# Patient Record
Sex: Female | Born: 1960 | Race: White | Hispanic: No | Marital: Single | Smoking: Former smoker
Health system: Southern US, Community
[De-identification: ages and names within clinical notes are randomized; demographics above are authoritative.]

## PROBLEM LIST (undated history)

## (undated) DIAGNOSIS — K56609 Unspecified intestinal obstruction, unspecified as to partial versus complete obstruction: Secondary | ICD-10-CM

## (undated) DIAGNOSIS — K5010 Crohn's disease of large intestine without complications: Secondary | ICD-10-CM

## (undated) HISTORY — PX: TONSILLECTOMY: SUR1361

## (undated) HISTORY — PX: ABDOMINAL HYSTERECTOMY: SHX81

## (undated) HISTORY — PX: APPENDECTOMY: SHX54

---

## 1997-09-19 ENCOUNTER — Other Ambulatory Visit: Admission: RE | Admit: 1997-09-19 | Discharge: 1997-09-19 | Payer: Self-pay | Admitting: *Deleted

## 2000-09-05 ENCOUNTER — Encounter: Admission: RE | Admit: 2000-09-05 | Discharge: 2000-09-05 | Payer: Self-pay

## 2003-09-14 ENCOUNTER — Emergency Department (HOSPITAL_COMMUNITY): Admission: AD | Admit: 2003-09-14 | Discharge: 2003-09-14 | Payer: Self-pay | Admitting: Family Medicine

## 2009-08-13 ENCOUNTER — Encounter: Admission: RE | Admit: 2009-08-13 | Discharge: 2009-08-13 | Payer: Self-pay | Admitting: Gastroenterology

## 2010-06-28 ENCOUNTER — Encounter: Payer: Self-pay | Admitting: Gastroenterology

## 2014-11-06 DIAGNOSIS — K56609 Unspecified intestinal obstruction, unspecified as to partial versus complete obstruction: Secondary | ICD-10-CM

## 2014-11-06 HISTORY — DX: Unspecified intestinal obstruction, unspecified as to partial versus complete obstruction: K56.609

## 2014-11-13 ENCOUNTER — Inpatient Hospital Stay (HOSPITAL_COMMUNITY)
Admission: EM | Admit: 2014-11-13 | Discharge: 2014-11-23 | DRG: 386 | Disposition: A | Discharge status: Home / Self Care | Payer: Managed Care, Other (non HMO) | Attending: Oncology | Admitting: Oncology

## 2014-11-13 ENCOUNTER — Emergency Department (HOSPITAL_COMMUNITY): Payer: Managed Care, Other (non HMO)

## 2014-11-13 ENCOUNTER — Encounter (HOSPITAL_COMMUNITY): Payer: Self-pay | Admitting: Emergency Medicine

## 2014-11-13 DIAGNOSIS — E440 Moderate protein-calorie malnutrition: Secondary | ICD-10-CM | POA: Diagnosis present

## 2014-11-13 DIAGNOSIS — K5090 Crohn's disease, unspecified, without complications: Secondary | ICD-10-CM

## 2014-11-13 DIAGNOSIS — R7989 Other specified abnormal findings of blood chemistry: Secondary | ICD-10-CM | POA: Diagnosis present

## 2014-11-13 DIAGNOSIS — F1120 Opioid dependence, uncomplicated: Secondary | ICD-10-CM | POA: Diagnosis present

## 2014-11-13 DIAGNOSIS — T403X5A Adverse effect of methadone, initial encounter: Secondary | ICD-10-CM | POA: Diagnosis present

## 2014-11-13 DIAGNOSIS — Z978 Presence of other specified devices: Secondary | ICD-10-CM

## 2014-11-13 DIAGNOSIS — K5909 Other constipation: Secondary | ICD-10-CM | POA: Diagnosis present

## 2014-11-13 DIAGNOSIS — K5080 Crohn's disease of both small and large intestine without complications: Secondary | ICD-10-CM | POA: Diagnosis present

## 2014-11-13 DIAGNOSIS — R188 Other ascites: Secondary | ICD-10-CM | POA: Diagnosis present

## 2014-11-13 DIAGNOSIS — F419 Anxiety disorder, unspecified: Secondary | ICD-10-CM | POA: Diagnosis present

## 2014-11-13 DIAGNOSIS — D473 Essential (hemorrhagic) thrombocythemia: Secondary | ICD-10-CM | POA: Diagnosis present

## 2014-11-13 DIAGNOSIS — R1084 Generalized abdominal pain: Secondary | ICD-10-CM

## 2014-11-13 DIAGNOSIS — Z79891 Long term (current) use of opiate analgesic: Secondary | ICD-10-CM

## 2014-11-13 DIAGNOSIS — Z79899 Other long term (current) drug therapy: Secondary | ICD-10-CM | POA: Diagnosis not present

## 2014-11-13 DIAGNOSIS — T380X5A Adverse effect of glucocorticoids and synthetic analogues, initial encounter: Secondary | ICD-10-CM | POA: Diagnosis present

## 2014-11-13 DIAGNOSIS — J9811 Atelectasis: Secondary | ICD-10-CM | POA: Diagnosis not present

## 2014-11-13 DIAGNOSIS — Z6820 Body mass index (BMI) 20.0-20.9, adult: Secondary | ICD-10-CM | POA: Diagnosis not present

## 2014-11-13 DIAGNOSIS — D72829 Elevated white blood cell count, unspecified: Secondary | ICD-10-CM | POA: Diagnosis present

## 2014-11-13 DIAGNOSIS — K56609 Unspecified intestinal obstruction, unspecified as to partial versus complete obstruction: Secondary | ICD-10-CM

## 2014-11-13 DIAGNOSIS — K56600 Partial intestinal obstruction, unspecified as to cause: Secondary | ICD-10-CM | POA: Diagnosis present

## 2014-11-13 DIAGNOSIS — K50912 Crohn's disease, unspecified, with intestinal obstruction: Secondary | ICD-10-CM | POA: Diagnosis not present

## 2014-11-13 DIAGNOSIS — K50812 Crohn's disease of both small and large intestine with intestinal obstruction: Secondary | ICD-10-CM | POA: Diagnosis not present

## 2014-11-13 DIAGNOSIS — K5010 Crohn's disease of large intestine without complications: Secondary | ICD-10-CM | POA: Diagnosis present

## 2014-11-13 DIAGNOSIS — D75838 Other thrombocytosis: Secondary | ICD-10-CM | POA: Diagnosis present

## 2014-11-13 DIAGNOSIS — K50112 Crohn's disease of large intestine with intestinal obstruction: Secondary | ICD-10-CM

## 2014-11-13 DIAGNOSIS — R110 Nausea: Secondary | ICD-10-CM | POA: Diagnosis present

## 2014-11-13 DIAGNOSIS — K5669 Other intestinal obstruction: Secondary | ICD-10-CM | POA: Diagnosis not present

## 2014-11-13 HISTORY — DX: Crohn's disease of large intestine without complications: K50.10

## 2014-11-13 HISTORY — DX: Unspecified intestinal obstruction, unspecified as to partial versus complete obstruction: K56.609

## 2014-11-13 LAB — URINE MICROSCOPIC-ADD ON

## 2014-11-13 LAB — COMPREHENSIVE METABOLIC PANEL
ALK PHOS: 124 U/L (ref 38–126)
ALT: 17 U/L (ref 14–54)
AST: 20 U/L (ref 15–41)
Albumin: 2.7 g/dL — ABNORMAL LOW (ref 3.5–5.0)
Anion gap: 12 (ref 5–15)
BILIRUBIN TOTAL: 0.5 mg/dL (ref 0.3–1.2)
BUN: 7 mg/dL (ref 6–20)
CO2: 23 mmol/L (ref 22–32)
Calcium: 8.6 mg/dL — ABNORMAL LOW (ref 8.9–10.3)
Chloride: 97 mmol/L — ABNORMAL LOW (ref 101–111)
Creatinine, Ser: 0.73 mg/dL (ref 0.44–1.00)
GFR calc non Af Amer: >60 mL/min (ref >60)
Glucose, Bld: 273 mg/dL — ABNORMAL HIGH (ref 65–99)
Potassium: 3.4 mmol/L — ABNORMAL LOW (ref 3.5–5.1)
Sodium: 132 mmol/L — ABNORMAL LOW (ref 135–145)
TOTAL PROTEIN: 6.5 g/dL (ref 6.5–8.1)

## 2014-11-13 LAB — CBC WITH DIFFERENTIAL/PLATELET
BASOS ABS: 0 10*3/uL (ref 0.0–0.1)
Basophils Relative: 0 % (ref 0–1)
Eosinophils Absolute: 0 10*3/uL (ref 0.0–0.7)
Eosinophils Relative: 0 % (ref 0–5)
HEMATOCRIT: 34.5 % — AB (ref 36.0–46.0)
Hemoglobin: 11.5 g/dL — ABNORMAL LOW (ref 12.0–15.0)
Lymphocytes Relative: 12 % (ref 12–46)
Lymphs Abs: 1.2 10*3/uL (ref 0.7–4.0)
MCH: 26.6 pg (ref 26.0–34.0)
MCHC: 33.3 g/dL (ref 30.0–36.0)
MCV: 79.7 fL (ref 78.0–100.0)
Monocytes Absolute: 0.5 10*3/uL (ref 0.1–1.0)
Monocytes Relative: 5 % (ref 3–12)
Neutro Abs: 8.2 10*3/uL — ABNORMAL HIGH (ref 1.7–7.7)
Neutrophils Relative %: 83 % — ABNORMAL HIGH (ref 43–77)
PLATELETS: 590 10*3/uL — AB (ref 150–400)
RBC: 4.33 MIL/uL (ref 3.87–5.11)
RDW: 15.7 % — AB (ref 11.5–15.5)
WBC: 9.9 10*3/uL (ref 4.0–10.5)

## 2014-11-13 LAB — URINALYSIS, ROUTINE W REFLEX MICROSCOPIC
Bilirubin Urine: NEGATIVE
Glucose, UA: NEGATIVE mg/dL
Ketones, ur: NEGATIVE mg/dL
LEUKOCYTES UA: NEGATIVE
NITRITE: NEGATIVE
Protein, ur: NEGATIVE mg/dL
UROBILINOGEN UA: 0.2 mg/dL (ref 0.0–1.0)
pH: 5.5 (ref 5.0–8.0)

## 2014-11-13 LAB — I-STAT CG4 LACTIC ACID, ED
Lactic Acid, Venous: 1.61 mmol/L (ref 0.5–2.0)
Lactic Acid, Venous: 1.61 mmol/L (ref 0.5–2.0)

## 2014-11-13 LAB — CREATININE, SERUM
Creatinine, Ser: 0.71 mg/dL (ref 0.44–1.00)
GFR calc Af Amer: >60 mL/min (ref >60)
GFR calc non Af Amer: >60 mL/min (ref >60)

## 2014-11-13 LAB — CBC
HEMATOCRIT: 31.9 % — AB (ref 36.0–46.0)
Hemoglobin: 10.8 g/dL — ABNORMAL LOW (ref 12.0–15.0)
MCH: 26.6 pg (ref 26.0–34.0)
MCHC: 33.9 g/dL (ref 30.0–36.0)
MCV: 78.6 fL (ref 78.0–100.0)
Platelets: 412 10*3/uL — ABNORMAL HIGH (ref 150–400)
RBC: 4.06 MIL/uL (ref 3.87–5.11)
RDW: 15.9 % — ABNORMAL HIGH (ref 11.5–15.5)
WBC: 14.7 10*3/uL — ABNORMAL HIGH (ref 4.0–10.5)

## 2014-11-13 LAB — C-REACTIVE PROTEIN: CRP: 14.4 mg/dL — ABNORMAL HIGH (ref <1.0)

## 2014-11-13 MED ORDER — SODIUM CHLORIDE 0.9 % IV BOLUS (SEPSIS)
1000.0000 mL | Freq: Once | INTRAVENOUS | Status: AC
  Administered 2014-11-13: 1000 mL via INTRAVENOUS

## 2014-11-13 MED ORDER — METHYLPREDNISOLONE SODIUM SUCC 125 MG IJ SOLR
125.0000 mg | Freq: Once | INTRAMUSCULAR | Status: AC
  Administered 2014-11-13: 125 mg via INTRAVENOUS
  Filled 2014-11-13: qty 2

## 2014-11-13 MED ORDER — PROMETHAZINE HCL 25 MG/ML IJ SOLN
25.0000 mg | Freq: Once | INTRAMUSCULAR | Status: AC
  Administered 2014-11-13: 25 mg via INTRAVENOUS
  Filled 2014-11-13: qty 1

## 2014-11-13 MED ORDER — CIPROFLOXACIN IN D5W 400 MG/200ML IV SOLN
400.0000 mg | Freq: Two times a day (BID) | INTRAVENOUS | Status: DC
  Administered 2014-11-13 – 2014-11-17 (×9): 400 mg via INTRAVENOUS
  Filled 2014-11-13 (×10): qty 200

## 2014-11-13 MED ORDER — METRONIDAZOLE IN NACL 5-0.79 MG/ML-% IV SOLN
500.0000 mg | Freq: Three times a day (TID) | INTRAVENOUS | Status: DC
  Administered 2014-11-13 – 2014-11-17 (×13): 500 mg via INTRAVENOUS
  Filled 2014-11-13 (×15): qty 100

## 2014-11-13 MED ORDER — HYDROMORPHONE HCL 1 MG/ML IJ SOLN
1.0000 mg | Freq: Once | INTRAMUSCULAR | Status: AC
  Administered 2014-11-13: 1 mg via INTRAVENOUS
  Filled 2014-11-13: qty 1

## 2014-11-13 MED ORDER — ONDANSETRON HCL 4 MG/2ML IJ SOLN
4.0000 mg | Freq: Once | INTRAMUSCULAR | Status: AC
  Administered 2014-11-13: 4 mg via INTRAVENOUS
  Filled 2014-11-13: qty 2

## 2014-11-13 MED ORDER — PROMETHAZINE HCL 25 MG/ML IJ SOLN
25.0000 mg | INTRAMUSCULAR | Status: DC | PRN
  Administered 2014-11-13 (×2): 25 mg via INTRAVENOUS
  Filled 2014-11-13 (×2): qty 1

## 2014-11-13 MED ORDER — IOHEXOL 300 MG/ML  SOLN
80.0000 mL | Freq: Once | INTRAMUSCULAR | Status: AC | PRN
  Administered 2014-11-13: 80 mL via INTRAVENOUS

## 2014-11-13 MED ORDER — METHADONE HCL 10 MG PO TABS
75.0000 mg | ORAL_TABLET | Freq: Every day | ORAL | Status: DC
  Administered 2014-11-14 – 2014-11-15 (×2): 75 mg via ORAL
  Filled 2014-11-13 (×2): qty 8

## 2014-11-13 MED ORDER — ONDANSETRON HCL 4 MG/2ML IJ SOLN: 4.0000 mg | Freq: Four times a day (QID) | INTRAMUSCULAR | Status: DC | PRN

## 2014-11-13 MED ORDER — POTASSIUM CHLORIDE IN NACL 20-0.9 MEQ/L-% IV SOLN
INTRAVENOUS | Status: DC
  Administered 2014-11-13 – 2014-11-15 (×4): via INTRAVENOUS
  Filled 2014-11-13 (×6): qty 1000

## 2014-11-13 MED ORDER — METHYLPREDNISOLONE SODIUM SUCC 125 MG IJ SOLR
80.0000 mg | Freq: Four times a day (QID) | INTRAMUSCULAR | Status: DC
  Administered 2014-11-13 – 2014-11-17 (×17): 80 mg via INTRAVENOUS
  Filled 2014-11-13 (×17): qty 2

## 2014-11-13 MED ORDER — HEPARIN SODIUM (PORCINE) 5000 UNIT/ML IJ SOLN
5000.0000 [IU] | Freq: Three times a day (TID) | INTRAMUSCULAR | Status: DC
  Administered 2014-11-13 – 2014-11-15 (×6): 5000 [IU] via SUBCUTANEOUS
  Filled 2014-11-13 (×5): qty 1

## 2014-11-13 MED ORDER — ACETAMINOPHEN 650 MG RE SUPP
650.0000 mg | Freq: Four times a day (QID) | RECTAL | Status: DC | PRN
  Administered 2014-11-13: 650 mg via RECTAL
  Filled 2014-11-13: qty 1

## 2014-11-13 NOTE — H&P (Signed)
Date: 11/13/2014               Patient Name:  Krystal [name] MRN: [id number]  DOB: [birth year] Age / Sex: 54 y.o., female   PCP: No Pcp Per Patient         Medical Service: Internal Medicine Teaching Service         Attending Physician: Dr. Oval Linsey, MD    First Contact: Dr. Trudee Kuster Pager: [phone]  Second Contact: Dr. Gordy Levan Pager: [phone]       After Hours (After 5p/  First Contact Pager: [phone]  weekends / holidays): Second Contact Pager: [phone]   Chief Complaint: abdominal pain  History of Present Illness: [name] is a 51 yr woman pmh Crohns disease not on any treatment and managed by methadone 75mg  presents with ongoing RLQ pain, nausea, and anorexia x2wks. Patient states that she has not followed with with her GI doctor since her "presumed" diagnosis and was given phenergan suppositories only at her initial visit. Since that time she has had bouts of pain and some anorexia/nausea that usually resolve in 1-2 wks and she never sought treatment or returned for her follow-up. 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 She has only been tolerating baby food. She states that the intensity of the pain got worse overnight along with the nausea, chills that caused her to present for evaluation. She denied any BRBPR, hematochezia, hematemesis, chest pain, inability to pass flatus, constipation, or skin rashes. Pt denied any recent illnesses. She is a non-smoker and doesn't use any other drugs.   Meds: Current Facility-Administered Medications  Medication Dose Route Frequency Provider Last Rate Last Dose  . HYDROmorphone (DILAUDID) injection 1 mg  1 mg Intravenous Once Merryl Hacker, MD      . methylPREDNISolone sodium succinate (SOLU-MEDROL) 125 mg/2 mL injection 125 mg  125 mg Intravenous Once Merryl Hacker, MD      . sodium chloride 0.9 % bolus 1,000 mL  1,000 mL Intravenous Once Merryl Hacker, MD       Current Outpatient Prescriptions  Medication Sig Dispense Refill  . methadone (DOLOPHINE) 10 MG/ML solution Take 75 mg by mouth daily.    . polyethylene glycol (MIRALAX / GLYCOLAX) packet Take 17 g by mouth daily.      Allergies: Allergies as of 11/13/2014 - Review Complete 11/13/2014  Allergen Reaction Noted  . Sulfa antibiotics Rash 11/13/2014   History reviewed. No pertinent past medical history. Past Surgical History  Procedure Laterality Date  . Abdominal hysterectomy     No family history on file. History   Social History  . Marital Status: Single    Spouse Name: N/A  . Number of Children: N/A  . Years of Education: N/A   Occupational History  . Not on file.   Social History Main Topics  . Smoking status: Never Smoker   . Smokeless tobacco: Not on file  . Alcohol Use: No  . Drug Use: Not on file  . Sexual Activity: Not on file   Other Topics Concern  . Not on file   Social History Narrative  . No narrative on file    Review of Systems: Pertinent items are noted in HPI.  Physical Exam: Blood pressure 128/68, pulse 99, resp. rate 30, SpO2 94 %. General: resting in  bed, uncomfortable, cachectic  HEENT: PERRL, EOMI, no scleral icterus Cardiac: RRR, no rubs, murmurs or gallops Pulm: clear to auscultation bilaterally, no crackles, wheezes, or rhonchi, moving normal volumes of air Abd: tense abdominal muscles, RLQ tenderness, some guarding, slightly distended, BS present Ext: warm and well perfused, no pedal edema Neuro: alert and oriented X3, cranial nerves II-XII grossly intact  Lab results: Basic Metabolic Panel:  Recent Labs  11/13/14 0258  NA 132*  K 3.4*  CL 97*  CO2 23  GLUCOSE 273*  BUN 7  CREATININE 0.73  CALCIUM 8.6*   Liver Function Tests:  Recent Labs  11/13/14 0258  AST 20  ALT 17  ALKPHOS 124  BILITOT 0.5  PROT 6.5   ALBUMIN 2.7*   CBC:  Recent Labs  11/13/14 0258  WBC 9.9  NEUTROABS 8.2*  HGB 11.5*  HCT 34.5*  MCV 79.7  PLT 590*   Imaging results:  Ct Abdomen Pelvis W Contrast  11/13/2014   CLINICAL DATA:  Severe generalized abdominal pain. Nausea. Crohn's disease.  EXAM: CT ABDOMEN AND PELVIS WITH CONTRAST  TECHNIQUE: Multidetector CT imaging of the abdomen and pelvis was performed using the standard protocol following bolus administration of intravenous contrast.  CONTRAST:  39mL OMNIPAQUE IOHEXOL 300 MG/ML  SOLN  COMPARISON:  08/13/2009  FINDINGS: Lower Chest:  Unremarkable.  Hepatobiliary: No masses identified. Mild periportal edema noted. Gallbladder is unremarkable.  Pancreas: No mass, inflammatory changes, or other significant abnormality identified.  Spleen:  Within normal limits in size and appearance.  Adrenals:  No masses identified.  Kidneys/Urinary Tract:  No evidence of masses or hydronephrosis.  Stomach/Bowel/Peritoneum: Mild to moderate dilatation of mid and distal small bowel loops is seen. There is moderate wall thickening and abnormal mucosal enhancement seen multiple loops of ileum within the right abdomen and pelvis, including the terminal ileum. Adjacent inflammatory changes are seen within the right abdominal and pelvic mesentery and there is also mild wall thickening involving the cecum and ascending colon. This is consistent with active Crohn's disease, with associated partial small bowel obstruction.  Mild to moderate ascites is seen in the both upper quadrants, right paracolic gutter, and pelvis, however no focal abscesses visualized. Small hiatal hernia also noted.  Vascular/Lymphatic: Mild lymphadenopathy is seen in the central small bowel mesentery, likely reactive in etiology. No other sites of lymphadenopathy identified.  Reproductive: Prior hysterectomy noted. No adnexal masses identified  Other:  None.  Musculoskeletal:  No suspicious bone lesions identified.  IMPRESSION:  Findings consistent with active Crohn's disease involving multiple distal small bowel loops including the terminal ileum, and the cecum. This is causing a partial small bowel obstruction.  Mild to moderate ascites mainly in the right abdomen and pelvis. No focal abscess or free air identified.  Mild lymphadenopathy in small bowel mesentery, likely reactive in etiology.   Electronically Signed   By: Earle Gell M.D.   On: 11/13/2014 07:20    Assessment & Plan by Problem: 1. Crohn's colitis: Pt has presumed crohn's and didn't follow up with her GI doctor. CT Abd shows finding consistent with active crohn's that explain pt RLQ pain. Pt without significant leukocytosis or fever. Pt CDAI score is between 100-150 therefore mild-moderate disease.  -NPO -phenergan for nausea -GI consult -continue IV solumedrol -continue pain management -GI pathogen panel to r/o co-infection as reason for flare -CRP -start IV cipro/flagyl for intraabdominal inflammation worry for microperforation -serial abdominal exams for concern of potential obstruction and need for surgery  Dispo: Disposition  is deferred at this time, awaiting improvement of current medical problems. Anticipated discharge in approximately 3-4 day(s).   The patient does not have a current PCP (No Pcp Per Patient) and does need an [facility] hospital follow-up appointment after discharge.  The patient does not have transportation limitations that hinder transportation to clinic appointments.  Signed: Jerrye Noble, MD 11/13/2014, 8:16 AM

## 2014-11-13 NOTE — ED Provider Notes (Signed)
CSN: [id number]     Arrival date & time 11/13/14  0141 History  This chart was scribed for Krystal Hacker, MD by Rayfield Citizen, ED Scribe. This patient was seen in room D33C/D33C and the patient's care was started at 2:25 AM.    Chief Complaint  Patient presents with  . Abdominal Pain   The history is provided by the patient. No language interpreter was used.     HPI Comments: Krystal [name] is a 54 y.o. female with past medical history of Crohn's disease who presents to the Emergency Department complaining of generalized abdominal pain, rated 10/10, and nausea. She also notes SOB. Patient explains her abdomen is normally firm and distended; she notes some discoloration after extended use of a heating pad which provides only mild relief. Patient states she is normally on a liquid diet and ate scrambled eggs tonight.   She takes methadone daily; no medications or prior surgical interventions for her Crohn's.  GI care with Dr. Ronald Lobo at [place].   History reviewed. No pertinent past medical history. Past Surgical History  Procedure Laterality Date  . Abdominal hysterectomy     No family history on file. History  Substance Use Topics  . Smoking status: Never Smoker   . Smokeless tobacco: Not on file  . Alcohol Use: No   OB History    No data available     Review of Systems  Constitutional: Negative for fever.  Respiratory: Negative for cough, chest tightness and shortness of breath.   Cardiovascular: Negative for chest pain.  Gastrointestinal: Positive for nausea, abdominal pain and diarrhea. Negative for vomiting and constipation.  Genitourinary: Negative for dysuria.  Musculoskeletal: Negative for back pain.  Skin: Positive for color change.  Neurological: Negative for headaches.  All other systems reviewed and are negative.     Allergies  Sulfa antibiotics  Home Medications   Prior to Admission medications   Medication Sig Start Date End Date Taking?  Authorizing Provider  methadone (DOLOPHINE) 10 MG/ML solution Take 75 mg by mouth daily.   Yes Historical Provider, MD  polyethylene glycol (MIRALAX / GLYCOLAX) packet Take 17 g by mouth daily.   Yes Historical Provider, MD   BP 106/60 mmHg  Pulse 92  Temp(Src) 98 F (36.7 C) (Oral)  Resp 15  SpO2 98% Physical Exam  Constitutional: She is oriented to person, place, and time.  Comfortable appearing  HENT:  Head: Normocephalic and atraumatic.  Eyes: Pupils are equal, round, and reactive to light.  Cardiovascular: Normal rate, regular rhythm and normal heart sounds.   No murmur heard. Pulmonary/Chest: Effort normal and breath sounds normal. No respiratory distress. She has no wheezes.  Abdominal: Soft. Bowel sounds are normal. She exhibits distension. There is tenderness. There is no rebound and no guarding.  Diffuse tenderness to palpation, greater over the right lower quadrant  Musculoskeletal: She exhibits no edema.  Neurological: She is alert and oriented to person, place, and time.  Skin: Skin is warm and dry.  Psychiatric: She has a normal mood and affect.  Nursing note and vitals reviewed.   ED Course  Procedures   DIAGNOSTIC STUDIES: Oxygen Saturation is 99% on RA, normal by my interpretation.   COORDINATION OF CARE: 2:29 AM Discussed treatment plan with pt at bedside and pt agreed to plan.   Labs Review Labs Reviewed  CBC WITH DIFFERENTIAL/PLATELET - Abnormal; Notable for the following:    Hemoglobin 11.5 (*)    HCT 34.5 (*)  RDW 15.7 (*)    Platelets 590 (*)    Neutrophils Relative % 83 (*)    Neutro Abs 8.2 (*)    All other components within normal limits  COMPREHENSIVE METABOLIC PANEL - Abnormal; Notable for the following:    Sodium 132 (*)    Potassium 3.4 (*)    Chloride 97 (*)    Glucose, Bld 273 (*)    Calcium 8.6 (*)    Albumin 2.7 (*)    All other components within normal limits  URINALYSIS, ROUTINE W REFLEX MICROSCOPIC (NOT AT [facility]) -  Abnormal; Notable for the following:    Specific Gravity, Urine <1.005 (*)    Hgb urine dipstick TRACE (*)    All other components within normal limits  C-REACTIVE PROTEIN - Abnormal; Notable for the following:    CRP 14.4 (*)    All other components within normal limits  CBC - Abnormal; Notable for the following:    WBC 14.7 (*)    Hemoglobin 10.8 (*)    HCT 31.9 (*)    RDW 15.9 (*)    Platelets 412 (*)    All other components within normal limits  STOOL CULTURE  URINE MICROSCOPIC-ADD ON  CREATININE, SERUM  URINE RAPID DRUG SCREEN (HOSP PERFORMED) NOT AT [facility]  COMPREHENSIVE METABOLIC PANEL  CBC  I-STAT CG4 LACTIC ACID, ED  I-STAT CG4 LACTIC ACID, ED    Imaging Review Ct Abdomen Pelvis W Contrast  11/13/2014   CLINICAL DATA:  Severe generalized abdominal pain. Nausea. Crohn's disease.  EXAM: CT ABDOMEN AND PELVIS WITH CONTRAST  TECHNIQUE: Multidetector CT imaging of the abdomen and pelvis was performed using the standard protocol following bolus administration of intravenous contrast.  CONTRAST:  79mL OMNIPAQUE IOHEXOL 300 MG/ML  SOLN  COMPARISON:  08/13/2009  FINDINGS: Lower Chest:  Unremarkable.  Hepatobiliary: No masses identified. Mild periportal edema noted. Gallbladder is unremarkable.  Pancreas: No mass, inflammatory changes, or other significant abnormality identified.  Spleen:  Within normal limits in size and appearance.  Adrenals:  No masses identified.  Kidneys/Urinary Tract:  No evidence of masses or hydronephrosis.  Stomach/Bowel/Peritoneum: Mild to moderate dilatation of mid and distal small bowel loops is seen. There is moderate wall thickening and abnormal mucosal enhancement seen multiple loops of ileum within the right abdomen and pelvis, including the terminal ileum. Adjacent inflammatory changes are seen within the right abdominal and pelvic mesentery and there is also mild wall thickening involving the cecum and ascending colon. This is consistent with active Crohn's  disease, with associated partial small bowel obstruction.  Mild to moderate ascites is seen in the both upper quadrants, right paracolic gutter, and pelvis, however no focal abscesses visualized. Small hiatal hernia also noted.  Vascular/Lymphatic: Mild lymphadenopathy is seen in the central small bowel mesentery, likely reactive in etiology. No other sites of lymphadenopathy identified.  Reproductive: Prior hysterectomy noted. No adnexal masses identified  Other:  None.  Musculoskeletal:  No suspicious bone lesions identified.  IMPRESSION: Findings consistent with active Crohn's disease involving multiple distal small bowel loops including the terminal ileum, and the cecum. This is causing a partial small bowel obstruction.  Mild to moderate ascites mainly in the right abdomen and pelvis. No focal abscess or free air identified.  Mild lymphadenopathy in small bowel mesentery, likely reactive in etiology.   Electronically Signed   By: Earle Gell M.D.   On: 11/13/2014 07:20     EKG Interpretation None      MDM  Final diagnoses:  Crohn's colitis, with intestinal obstruction   Patient with history of Crohn's. Presents with nausea and abdominal pain. Uncomfortable appearing.  Initial vital signs are reassuring.  No signs of peritonitis. Basic labwork obtained and patient given pain and nausea medication. As well as fluids. Lab work is largely reassuring. CT scan of the abdomen obtained and shows active Crohn's disease with partial small bowel obstruction. Discussed with teaching service for admit. [facility] consulted and will consult on the patient. Patient given 125 Solu-Medrol.   I personally performed the services described in this documentation, which was scribed in my presence. The recorded information has been reviewed and is accurate.      Krystal Hacker, MD 11/13/14 2257

## 2014-11-13 NOTE — ED Notes (Signed)
Pt returned from CT, pt unable to get CT at this time due to pts inability to lay flat.

## 2014-11-13 NOTE — ED Notes (Signed)
Attempted report 

## 2014-11-13 NOTE — ED Notes (Signed)
Report attempted 

## 2014-11-13 NOTE — ED Notes (Signed)
Family at bedside. 

## 2014-11-13 NOTE — ED Notes (Signed)
Per Dr. Dina Rich pt allowed to take home med   Pt took 75 mg Methadone , liquid, PO, Prescribed by Dr. Brayton Caves

## 2014-11-13 NOTE — ED Notes (Addendum)
Pt placed back in bed with this RN , Pt states "I think I need a catheter for my urine"

## 2014-11-13 NOTE — ED Notes (Signed)
See paper charting for triage. Triage completed during down time.

## 2014-11-13 NOTE — ED Notes (Signed)
Patient transported to CT 

## 2014-11-13 NOTE — Consult Note (Signed)
Subjective:   HPI  The patient is a 54 year old female who presented to the emergency room with complaints of severe abdominal pain and distention. She has been having progressive pain over the last couple of weeks. A CT scan was done which showed multiple distal small bowel loops including the terminal ileum and cecum affected by active Crohn's disease with associated partial small bowel obstruction. The patient states that she was diagnosed with a probable Crohn's disease in 2011 by Dr. Cristina Gong. At that time she had a CT of the abdomen and pelvis done which showed a 20 cm stricture in the terminal ileum. A colonoscopy with biopsies showed evidence of patchy ileitis. For some reason however which I am unclear the patient was never treated for Crohn's disease. She states that she never was prescribed any medication for Crohn's disease. She states that she has seen physicians for abdominal pain since that time and was prescribed pain medications but no specific treatment for Crohn's. For the past couple of years she has been using methadone for abdominal pain. Again she has never been treated for Crohn's disease. She has not been back to see GI since 2011.  Review of Systems No chest pain or shortness of breath History reviewed. No pertinent past medical history. Past Surgical History  Procedure Laterality Date  . Abdominal hysterectomy     History   Social History  . Marital Status: Single    Spouse Name: N/A  . Number of Children: N/A  . Years of Education: N/A   Occupational History  . Not on file.   Social History Main Topics  . Smoking status: Never Smoker   . Smokeless tobacco: Not on file  . Alcohol Use: No  . Drug Use: Not on file  . Sexual Activity: Not on file   Other Topics Concern  . Not on file   Social History Narrative  . No narrative on file   family history is not on file.  Current facility-administered medications:  .  ciprofloxacin (CIPRO) IVPB 400 mg, 400 mg,  Intravenous, Q12H, Jerrye Noble, MD, Last Rate: 200 mL/hr at 11/13/14 1049, 400 mg at 11/13/14 1049 .  metroNIDAZOLE (FLAGYL) IVPB 500 mg, 500 mg, Intravenous, Q8H, Jerrye Noble, MD, Stopped at 11/13/14 1045  Current outpatient prescriptions:  .  methadone (DOLOPHINE) 10 MG/ML solution, Take 75 mg by mouth daily., Disp: , Rfl:  .  polyethylene glycol (MIRALAX / GLYCOLAX) packet, Take 17 g by mouth daily., Disp: , Rfl:  Allergies  Allergen Reactions  . Sulfa Antibiotics Rash     Objective:     BP 116/66 mmHg  Pulse 101  Resp 16  SpO2 92%  She is alert and oriented  Nonicteric  Heart regular rhythm no murmurs  Lungs clear  Abdomen: Moderately distended, somewhat tympanitic, the skin of the lower abdomen below the umbilicus is a dark color purple/brownish color which she states is burning from a heating pad. The abdomen is diffusely tender but no rebound.  Laboratory No components found for: D1    Assessment:     #1. Crohn's disease  #2. Partial small bowel obstruction. It is unclear whether the partial small bowel obstruction is related to simply inflammatory tissue from Crohn's disease or fixed stricture from chronic Crohn's disease.      Plan:     Admit to hospital. Begin intravenous steroids. NG tube to low intermittent suction. Would observe symptoms over the next few days and see if evidence of small  bowel obstruction resolves. If not we will need surgical consult.

## 2014-11-13 NOTE — ED Notes (Signed)
When asked if pt could provide a urine sample the pt yelled "I'm not doing anything until this phenergan kicks in!".

## 2014-11-14 ENCOUNTER — Encounter (HOSPITAL_COMMUNITY): Payer: Self-pay | Admitting: General Practice

## 2014-11-14 DIAGNOSIS — D473 Essential (hemorrhagic) thrombocythemia: Secondary | ICD-10-CM

## 2014-11-14 DIAGNOSIS — D72829 Elevated white blood cell count, unspecified: Secondary | ICD-10-CM

## 2014-11-14 LAB — RAPID URINE DRUG SCREEN, HOSP PERFORMED
Barbiturates: NOT DETECTED
Benzodiazepines: NOT DETECTED
Cocaine: NOT DETECTED

## 2014-11-14 LAB — COMPREHENSIVE METABOLIC PANEL
ALBUMIN: 1.9 g/dL — AB (ref 3.5–5.0)
ALK PHOS: 73 U/L (ref 38–126)
ALT: 19 U/L (ref 14–54)
AST: 20 U/L (ref 15–41)
Anion gap: 10 (ref 5–15)
BUN: 9 mg/dL (ref 6–20)
CO2: 25 mmol/L (ref 22–32)
Calcium: 8 mg/dL — ABNORMAL LOW (ref 8.9–10.3)
Chloride: 106 mmol/L (ref 101–111)
Creatinine, Ser: 0.78 mg/dL (ref 0.44–1.00)
GFR calc Af Amer: >60 mL/min (ref >60)
GFR calc non Af Amer: >60 mL/min (ref >60)
GLUCOSE: 112 mg/dL — AB (ref 65–99)
POTASSIUM: 3.6 mmol/L (ref 3.5–5.1)
Sodium: 141 mmol/L (ref 135–145)
Total Bilirubin: 0.4 mg/dL (ref 0.3–1.2)
Total Protein: 5.4 g/dL — ABNORMAL LOW (ref 6.5–8.1)

## 2014-11-14 LAB — CBC
HCT: 30 % — ABNORMAL LOW (ref 36.0–46.0)
HEMOGLOBIN: 10.1 g/dL — AB (ref 12.0–15.0)
MCH: 26.5 pg (ref 26.0–34.0)
MCHC: 33.7 g/dL (ref 30.0–36.0)
MCV: 78.7 fL (ref 78.0–100.0)
PLATELETS: 422 10*3/uL — AB (ref 150–400)
RBC: 3.81 MIL/uL — ABNORMAL LOW (ref 3.87–5.11)
RDW: 15.9 % — ABNORMAL HIGH (ref 11.5–15.5)
WBC: 18.3 10*3/uL — AB (ref 4.0–10.5)

## 2014-11-14 NOTE — Progress Notes (Signed)
Subjective:   Day of hospitalization: 1  VSS.  No overnight events.  Pt is feeling better, less abdominal pain.  No flatus.     Objective:   Vital signs in last 24 hours: Filed Vitals:   11/13/14 1530 11/13/14 2129 11/14/14 0517 11/14/14 0551  BP: 112/56 106/60 98/58 101/61  Pulse: 110 92 98 97  Temp:  98 F (36.7 C) 98.2 F (36.8 C)   TempSrc:  Oral Oral   Resp:  15 16   SpO2: 89% 98% 96%     Weight: There were no vitals filed for this visit.  I/Os:  Intake/Output Summary (Last 24 hours) at 11/14/14 1107 Last data filed at 11/14/14 0924  Gross per 24 hour  Intake 1613.33 ml  Output   1175 ml  Net 438.33 ml    Physical Exam: Constitutional: Vital signs reviewed.  Patient is lying in bed in no acute distress and cooperative with exam.   HEENT: [place]/AT; PERRL, EOMI, conjunctivae normal, no scleral icterus ; NGT in place.  Cardiovascular: RRR, no MRG Pulmonary/Chest: normal respiratory effort, no accessory muscle use, CTAB, no wheezes, rales, or rhonchi Abdominal: Distended, decreased BS, diffuse tenderness, redness over the lower abdomen; mild umbilical hernia Neurological: A&O x3, CN II-XII grossly intact; non-focal exam Extremities: 2+DP b/l, no C/C/E  Skin: Warm, dry and intact. No rash  Lab Results:  BMP:  Recent Labs Lab 11/13/14 0258 11/13/14 1847 11/14/14 0322  NA 132*  --  141  K 3.4*  --  3.6  CL 97*  --  106  CO2 23  --  25  GLUCOSE 273*  --  112*  BUN 7  --  9  CREATININE 0.73 0.71 0.78  CALCIUM 8.6*  --  8.0*    CBC:  Recent Labs Lab 11/13/14 0258 11/13/14 1847 11/14/14 0322  WBC 9.9 14.7* 18.3*  NEUTROABS 8.2*  --   --   HGB 11.5* 10.8* 10.1*  HCT 34.5* 31.9* 30.0*  MCV 79.7 78.6 78.7  PLT 590* 412* 422*    Coagulation: No results for input(s): LABPROT, INR in the last 168 hours.  CBG:           No results for input(s): GLUCAP in the last 168 hours.         HA1C:      No results for input(s): HGBA1C in the last 168  hours.  Lipid Panel: No results for input(s): CHOL, HDL, LDLCALC, TRIG, CHOLHDL, LDLDIRECT in the last 168 hours.  LFTs:  Recent Labs Lab 11/13/14 0258 11/14/14 0322  AST 20 20  ALT 17 19  ALKPHOS 124 73  BILITOT 0.5 0.4  PROT 6.5 5.4*  ALBUMIN 2.7* 1.9*    Pancreatic Enzymes: No results for input(s): LIPASE, AMYLASE in the last 168 hours.  Lactic Acid/Procalcitonin:  Recent Labs Lab 11/13/14 0308 11/13/14 0520  LATICACIDVEN 1.61 1.61    Ammonia: No results for input(s): AMMONIA in the last 168 hours.  Cardiac Enzymes: No results for input(s): CKTOTAL, CKMB, CKMBINDEX, TROPONINI in the last 168 hours.  EKG: EKG Interpretation  Date/Time:    Ventricular Rate:    PR Interval:    QRS Duration:   QT Interval:    QTC Calculation:   R Axis:     Text Interpretation:     BNP: No results for input(s): PROBNP in the last 168 hours.  D-Dimer: No results for input(s): DDIMER in the last 168 hours.  Urinalysis:  Recent Labs Lab 11/13/14  0830  COLORURINE YELLOW  LABSPEC <1.005*  PHURINE 5.5  GLUCOSEU NEGATIVE  HGBUR TRACE*  BILIRUBINUR NEGATIVE  KETONESUR NEGATIVE  PROTEINUR NEGATIVE  UROBILINOGEN 0.2  NITRITE NEGATIVE  LEUKOCYTESUR NEGATIVE    Micro Results: No results found for this or any previous visit (from the past 240 hour(s)).  Blood Culture: No results found for: SDES, SPECREQUEST, CULT, REPTSTATUS  Studies/Results: Ct Abdomen Pelvis W Contrast  11/13/2014   CLINICAL DATA:  Severe generalized abdominal pain. Nausea. Crohn's disease.  EXAM: CT ABDOMEN AND PELVIS WITH CONTRAST  TECHNIQUE: Multidetector CT imaging of the abdomen and pelvis was performed using the standard protocol following bolus administration of intravenous contrast.  CONTRAST:  63mL OMNIPAQUE IOHEXOL 300 MG/ML  SOLN  COMPARISON:  08/13/2009  FINDINGS: Lower Chest:  Unremarkable.  Hepatobiliary: No masses identified. Mild periportal edema noted. Gallbladder is unremarkable.   Pancreas: No mass, inflammatory changes, or other significant abnormality identified.  Spleen:  Within normal limits in size and appearance.  Adrenals:  No masses identified.  Kidneys/Urinary Tract:  No evidence of masses or hydronephrosis.  Stomach/Bowel/Peritoneum: Mild to moderate dilatation of mid and distal small bowel loops is seen. There is moderate wall thickening and abnormal mucosal enhancement seen multiple loops of ileum within the right abdomen and pelvis, including the terminal ileum. Adjacent inflammatory changes are seen within the right abdominal and pelvic mesentery and there is also mild wall thickening involving the cecum and ascending colon. This is consistent with active Crohn's disease, with associated partial small bowel obstruction.  Mild to moderate ascites is seen in the both upper quadrants, right paracolic gutter, and pelvis, however no focal abscesses visualized. Small hiatal hernia also noted.  Vascular/Lymphatic: Mild lymphadenopathy is seen in the central small bowel mesentery, likely reactive in etiology. No other sites of lymphadenopathy identified.  Reproductive: Prior hysterectomy noted. No adnexal masses identified  Other:  None.  Musculoskeletal:  No suspicious bone lesions identified.  IMPRESSION: Findings consistent with active Crohn's disease involving multiple distal small bowel loops including the terminal ileum, and the cecum. This is causing a partial small bowel obstruction.  Mild to moderate ascites mainly in the right abdomen and pelvis. No focal abscess or free air identified.  Mild lymphadenopathy in small bowel mesentery, likely reactive in etiology.   Electronically Signed   By: Earle Gell M.D.   On: 11/13/2014 07:20    Medications:  Scheduled Meds: . ciprofloxacin  400 mg Intravenous Q12H  . heparin  5,000 Units Subcutaneous 3 times per day  . methadone  75 mg Oral Daily  . methylPREDNISolone (SOLU-MEDROL) injection  80 mg Intravenous Q6H  .  metronidazole  500 mg Intravenous Q8H   Continuous Infusions: . 0.9 % NaCl with KCl 20 mEq / L 100 mL/hr at 11/14/14 0536   PRN Meds: acetaminophen, ondansetron (ZOFRAN) IV, promethazine  Antibiotics: Antibiotics Given (last 72 hours)    None      Day of Hospitalization: 1  Consults: Treatment Team:  Ronald Lobo, MD Wonda Horner, MD  Assessment/Plan:   Principal Problem:   Partial small bowel obstruction Active Problems:   Crohn's colitis   Reactive thrombocytosis   Long-term current use of methadone for opiate dependence  Partial SBO Pt feels better today.  Denies flatus/no BM with decreased BS.  She has put out 64ml dark liquid from her NGT.  Afebrile.  Leukocytosis likely d/t steroids.  Unclear if SBO related to inflammation or from fixed stricture.   -cont to monitor  -  surgery consulted, recommended conservative mgmt for now  -cont NGT suction   Crohn's colitis Pt has presumed crohn's and didn't follow up Dr. Cristina Gong.  CT Abd shows finding c/w active crohn's and partial SBO.  CRP elevated.  Pt has been put on cipro/flagyl and solumedrol.  Leukocytosis this AM likely d/t steroids.    -cont NPO and NGT suction  -phenergan for nausea -appreciate GI, surgery recs  -cont IV solumedrol, zofran, zofran  -GI pathogen panel to r/o co-infection as reason for flare -cont IV cipro/flagyl for intraabdominal inflammation worry for microperforation  Tthrombocytosis Likely reactive d/t ongoing inflammation/infection.   -cont to monitor   F/E/N Fluids- NS with KCL 20 mEq 174ml/h Electrolytes- Replete as needed  Nutrition- NPO  VTE PPx  heparin 5000 units tid    Disposition Disposition is deferred, awaiting improvement of current medical problems.  Anticipated discharge in approximately 1-2 day(s).     LOS: 1 day   Jones Bales, MD PGY-2, Internal Medicine Teaching Service 11/14/2014, 11:07 AM

## 2014-11-14 NOTE — Care Management Note (Signed)
Case Management Note  Patient Details  Name: Krystal [name] MRN: [id number] Date of Birth: 01/15/1961  Subjective/Objective:      Pt admitted on 11/13/14 with Crohn's colitis.  PTA, pt resides at home with spouse and is independent.                Action/Plan: Will follow for dc needs as pt progresses.  UR completed.    Expected Discharge Date:                  Expected Discharge Plan:  Home/Self Care  In-House Referral:     Discharge planning Services  CM Consult  Post Acute Care Choice:    Choice offered to:     DME Arranged:    DME Agency:     HH Arranged:    HH Agency:     Status of Service:  In process, will continue to follow  Medicare Important Message Given:    Date Medicare IM Given:    Medicare IM give by:    Date Additional Medicare IM Given:    Additional Medicare Important Message give by:     If discussed at [place] of Stay Meetings, dates discussed:    Additional Comments:  Reinaldo Raddle, RN, BSN  Trauma/Neuro ICU Case Manager [phone]

## 2014-11-14 NOTE — Progress Notes (Signed)
Subjective: The patient feels a lot better this morning. States that abdominal pain, distention have improved. She is still not passing gas or moving her bowels. Reports she is anxious to get better to attend her daughter's wedding in [place].   Objective: Vital signs in last 24 hours: Filed Vitals:   11/13/14 1530 11/13/14 2129 11/14/14 0517 11/14/14 0551  BP: 112/56 106/60 98/58 101/61  Pulse: 110 92 98 97  Temp:  98 F (36.7 C) 98.2 F (36.8 C)   TempSrc:  Oral Oral   Resp:  15 16   SpO2: 89% 98% 96%    Weight change:   Intake/Output Summary (Last 24 hours) at 11/14/14 1106 Last data filed at 11/14/14 0924  Gross per 24 hour  Intake 1613.33 ml  Output   1175 ml  Net 438.33 ml   General: resting in bed, in no acute distress, non diaphoretic, NG tube in place, draining dark bloody fluid  Cardiac: RRR, no rubs, murmurs or gallops Pulm: clear to auscultation bilaterally, no crackles, wheezes, or rhonchi, moving normal volumes of air Abd: distended, tympanic, quiet BS, diffusely tender to palpation, skin discoloration below umbilicus consistent with previous exam Ext: warm and well perfused, no pedal edema, 2+ pulses   Lab Results: Basic Metabolic Panel:  Recent Labs  11/13/14 0258 11/13/14 1847 11/14/14 0322  NA 132*  --  141  K 3.4*  --  3.6  CL 97*  --  106  CO2 23  --  25  GLUCOSE 273*  --  112*  BUN 7  --  9  CREATININE 0.73 0.71 0.78  CALCIUM 8.6*  --  8.0*   Liver Function Tests:  Recent Labs  11/13/14 0258 11/14/14 0322  AST 20 20  ALT 17 19  ALKPHOS 124 73  BILITOT 0.5 0.4  PROT 6.5 5.4*  ALBUMIN 2.7* 1.9*   CBC:  Recent Labs  11/13/14 0258 11/13/14 1847 11/14/14 0322  WBC 9.9 14.7* 18.3*  NEUTROABS 8.2*  --   --   HGB 11.5* 10.8* 10.1*  HCT 34.5* 31.9* 30.0*  MCV 79.7 78.6 78.7  PLT 590* 412* 422*   Drugs of Abuse     Component Value Date/Time   LABOPIA RESULTS UNAVAILABLE DUE TO INTERFERING SUBSTANCE* 11/14/2014 0853   COCAINSCRNUR NONE DETECTED 11/14/2014 0853   LABBENZ NONE DETECTED 11/14/2014 0853   AMPHETMU RESULTS UNAVAILABLE DUE TO INTERFERING SUBSTANCE* 11/14/2014 0853   THCU RESULTS UNAVAILABLE DUE TO INTERFERING SUBSTANCE* 11/14/2014 0853   LABBARB NONE DETECTED 11/14/2014 0853   Urinalysis:  Recent Labs  11/13/14 0830  COLORURINE YELLOW  LABSPEC <1.005*  PHURINE 5.5  GLUCOSEU NEGATIVE  HGBUR TRACE*  BILIRUBINUR NEGATIVE  KETONESUR NEGATIVE  PROTEINUR NEGATIVE  UROBILINOGEN 0.2  NITRITE NEGATIVE  LEUKOCYTESUR NEGATIVE    Medications: I have reviewed the patient's current medications. Scheduled Meds: . ciprofloxacin  400 mg Intravenous Q12H  . heparin  5,000 Units Subcutaneous 3 times per day  . methadone  75 mg Oral Daily  . methylPREDNISolone (SOLU-MEDROL) injection  80 mg Intravenous Q6H  . metronidazole  500 mg Intravenous Q8H   Continuous Infusions: . 0.9 % NaCl with KCl 20 mEq / L 100 mL/hr at 11/14/14 0536   PRN Meds:.acetaminophen, ondansetron (ZOFRAN) IV, promethazine Assessment/Plan: Principal Problem:   Partial small bowel obstruction Active Problems:   Crohn's colitis   Reactive thrombocytosis   Long-term current use of methadone for opiate dependence  Ms. Luckey is a 54 yo F with a h/o diagnosed  Chron's disease, not on any therapy, who presents with abdominal pain, nausea, and anorexia. CT abdomen showed findings consistent with active Chron's disease in the distal small bowel and a partial small bowel obstruction, due to either active inflammation vs stricture from chronic uncontrolled Chron's. Today with improvement of symptoms, but still distended on exam.   Chron's colitis Pt with Chron's diagnosis since 2011, not on any therapy at home. CT abdomen consistent with active Chron's disease throughout distal small bowel, including the terminal ileum and cecum. CRP elevated at 14.4. Leukocytosis on CBC today likely due to demargination s/p IV steroids.  Thrombocytopenia on CBC is most likely reactive.  - GI consulted, recs to continue IV steroids, abx, NG suction on low intermittent for several days, awaiting resolution of SBO - cnt IV Solumedrol - cnt IV Ciprofloxacin, Flagyl  - NPO - NG suction  - Phenergan, Zofran for nausea prn  - Tylenol prn for pain  - f/u on GI pathogen panel to r/o coinfection as reason for flare   Partial SBO Visualized on CT. Patient continues to be distended, tympanic on abdominal exam, with quiet bowel sounds. No flatus, no BM since admission. NG continues to suction dark bloody fluid. No evidence of peritoneal signs or concern for perforation at this point.  - surgery consulted: will continue to monitor, conservative medical management for now  - awaiting flatus/ BM - continued NG suction - NPO  DVT Ppx - heparin 5000 tid   FENGI: -NPO -NS with KCl 20 mEq @100  - Replete electrolytes as needed.   Dispo: Awaiting improvement of medical problems. Anticipated discharge in 2-3 days.   This is a Careers information officer Note.  The care of the patient was discussed with Dr. Gordy Levan and the assessment and plan formulated with their assistance.  Please see their attached note for official documentation of the daily encounter.   LOS: 1 day   [name], Med Student 11/14/2014, 11:06 AM

## 2014-11-14 NOTE — Progress Notes (Signed)
[facility] Progress Note  Subjective: She feels better today with NG tube suction and having been receiving IV steroids. Her abdomen is less distended. No vomiting. No bowel movements or flatus yet.  Objective: Vital signs in last 24 hours: Temp:  [98 F (36.7 C)-98.2 F (36.8 C)] 98.2 F (36.8 C) (06/09 0517) Pulse Rate:  [91-113] 97 (06/09 0551) Resp:  [15-16] 16 (06/09 0517) BP: (98-125)/(51-65) 101/61 mmHg (06/09 0551) SpO2:  [89 %-98 %] 96 % (06/09 0517) Weight change:    PE:  No distress  Heart regular rhythm  Lungs clear  Abdomen: Bowel sounds diminished, soft but mildly protuberant however not as bad as yesterday, not tender like yesterday either  Lab Results: Results for orders placed or performed during the hospital encounter of 11/13/14 (from the past 24 hour(s))  CBC     Status: Abnormal   Collection Time: 11/13/14  6:47 PM  Result Value Ref Range   WBC 14.7 (H) 4.0 - 10.5 K/uL   RBC 4.06 3.87 - 5.11 MIL/uL   Hemoglobin 10.8 (L) 12.0 - 15.0 g/dL   HCT 31.9 (L) 36.0 - 46.0 %   MCV 78.6 78.0 - 100.0 fL   MCH 26.6 26.0 - 34.0 pg   MCHC 33.9 30.0 - 36.0 g/dL   RDW 15.9 (H) 11.5 - 15.5 %   Platelets 412 (H) 150 - 400 K/uL  Creatinine, serum     Status: None   Collection Time: 11/13/14  6:47 PM  Result Value Ref Range   Creatinine, Ser 0.71 0.44 - 1.00 mg/dL   GFR calc non Af Amer >60 >60 mL/min   GFR calc Af Amer >60 >60 mL/min  Comprehensive metabolic panel     Status: Abnormal   Collection Time: 11/14/14  3:22 AM  Result Value Ref Range   Sodium 141 135 - 145 mmol/L   Potassium 3.6 3.5 - 5.1 mmol/L   Chloride 106 101 - 111 mmol/L   CO2 25 22 - 32 mmol/L   Glucose, Bld 112 (H) 65 - 99 mg/dL   BUN 9 6 - 20 mg/dL   Creatinine, Ser 0.78 0.44 - 1.00 mg/dL   Calcium 8.0 (L) 8.9 - 10.3 mg/dL   Total Protein 5.4 (L) 6.5 - 8.1 g/dL   Albumin 1.9 (L) 3.5 - 5.0 g/dL   AST 20 15 - 41 U/L   ALT 19 14 - 54 U/L   Alkaline Phosphatase 73 38 - 126  U/L   Total Bilirubin 0.4 0.3 - 1.2 mg/dL   GFR calc non Af Amer >60 >60 mL/min   GFR calc Af Amer >60 >60 mL/min   Anion gap 10 5 - 15  CBC     Status: Abnormal   Collection Time: 11/14/14  3:22 AM  Result Value Ref Range   WBC 18.3 (H) 4.0 - 10.5 K/uL   RBC 3.81 (L) 3.87 - 5.11 MIL/uL   Hemoglobin 10.1 (L) 12.0 - 15.0 g/dL   HCT 30.0 (L) 36.0 - 46.0 %   MCV 78.7 78.0 - 100.0 fL   MCH 26.5 26.0 - 34.0 pg   MCHC 33.7 30.0 - 36.0 g/dL   RDW 15.9 (H) 11.5 - 15.5 %   Platelets 422 (H) 150 - 400 K/uL  Urine rapid drug screen (hosp performed)not at [facility]     Status: Abnormal   Collection Time: 11/14/14  8:53 AM  Result Value Ref Range   Opiates RESULTS UNAVAILABLE DUE TO INTERFERING SUBSTANCE (A) NONE DETECTED  Cocaine NONE DETECTED NONE DETECTED   Benzodiazepines NONE DETECTED NONE DETECTED   Amphetamines RESULTS UNAVAILABLE DUE TO INTERFERING SUBSTANCE (A) NONE DETECTED   Tetrahydrocannabinol RESULTS UNAVAILABLE DUE TO INTERFERING SUBSTANCE (A) NONE DETECTED   Barbiturates NONE DETECTED NONE DETECTED    Studies/Results: No results found.    Assessment: Crohn's disease with partial small bowel obstruction  Plan:   Continue NG tube suction for a couple of more days. Continue IV steroids. We will then have to observe for a clinical response. It is unclear whether or not the partial small bowel obstruction is related to edema from activity of Crohn's disease or fixed scarring. Time will tell.    Cassell Clement 11/14/2014, 12:39 PM  Pager: [phone] If no answer or after 5 PM call [phone]

## 2014-11-15 DIAGNOSIS — R188 Other ascites: Secondary | ICD-10-CM

## 2014-11-15 DIAGNOSIS — K5669 Other intestinal obstruction: Secondary | ICD-10-CM

## 2014-11-15 DIAGNOSIS — R590 Localized enlarged lymph nodes: Secondary | ICD-10-CM

## 2014-11-15 LAB — BASIC METABOLIC PANEL
ANION GAP: 11 (ref 5–15)
BUN: 17 mg/dL (ref 6–20)
CO2: 22 mmol/L (ref 22–32)
Calcium: 8.6 mg/dL — ABNORMAL LOW (ref 8.9–10.3)
Chloride: 112 mmol/L — ABNORMAL HIGH (ref 101–111)
Creatinine, Ser: 0.8 mg/dL (ref 0.44–1.00)
GFR calc Af Amer: >60 mL/min (ref >60)
GFR calc non Af Amer: >60 mL/min (ref >60)
Glucose, Bld: 107 mg/dL — ABNORMAL HIGH (ref 65–99)
Potassium: 4.1 mmol/L (ref 3.5–5.1)
SODIUM: 145 mmol/L (ref 135–145)

## 2014-11-15 MED ORDER — ENOXAPARIN SODIUM 40 MG/0.4ML ~~LOC~~ SOLN
40.0000 mg | SUBCUTANEOUS | Status: DC
  Administered 2014-11-15 – 2014-11-22 (×8): 40 mg via SUBCUTANEOUS
  Filled 2014-11-15 (×9): qty 0.4

## 2014-11-15 MED ORDER — HYDROMORPHONE HCL 1 MG/ML IJ SOLN: 2.0000 mg | INTRAMUSCULAR | Status: DC

## 2014-11-15 MED ORDER — DEXTROSE-NACL 5-0.45 % IV SOLN
INTRAVENOUS | Status: DC
  Administered 2014-11-15 – 2014-11-16 (×2): via INTRAVENOUS
  Administered 2014-11-16: 1 mL via INTRAVENOUS
  Administered 2014-11-17 (×2): via INTRAVENOUS

## 2014-11-15 MED ORDER — SALINE SPRAY 0.65 % NA SOLN
1.0000 | NASAL | Status: DC | PRN
  Filled 2014-11-15: qty 44

## 2014-11-15 MED ORDER — HYDROMORPHONE HCL 1 MG/ML IJ SOLN
2.0000 mg | INTRAMUSCULAR | Status: AC
  Administered 2014-11-16 – 2014-11-22 (×36): 2 mg via INTRAVENOUS
  Filled 2014-11-15 (×36): qty 2

## 2014-11-15 NOTE — Progress Notes (Signed)
Consulted to dose Lovenox for VTE prophylaxis.  No height or weight in computer. SCr normal at 0.8.  Lovenox 40mg  subQ q24h for VTE prophylaxis. Left instructions for pharmacy to adjust if height and weight require a dose increase.  Pharmacy to sign off as no adjustments anticipated.

## 2014-11-15 NOTE — Progress Notes (Signed)
[facility] Progress Note  Subjective: She is feeling much better today. No complaints of abdominal pain. Distention has improved quite a bit.  Objective: Vital signs in last 24 hours: Temp:  [97.9 F (36.6 C)-98.1 F (36.7 C)] 98 F (36.7 C) (06/10 0520) Pulse Rate:  [73-88] 86 (06/10 0919) Resp:  [17-18] 18 (06/10 0520) BP: (92-102)/(51-57) 92/57 mmHg (06/10 0520) SpO2:  [90 %-98 %] 93 % (06/10 0919) Weight change:    PE:  She is in no distress.  Heart regular rhythm  Lungs clear  Abdomen: Mild distention but soft and nontender  Lab Results: Results for orders placed or performed during the hospital encounter of 11/13/14 (from the past 24 hour(s))  Basic metabolic panel     Status: Abnormal   Collection Time: 11/15/14  4:43 AM  Result Value Ref Range   Sodium 145 135 - 145 mmol/L   Potassium 4.1 3.5 - 5.1 mmol/L   Chloride 112 (H) 101 - 111 mmol/L   CO2 22 22 - 32 mmol/L   Glucose, Bld 107 (H) 65 - 99 mg/dL   BUN 17 6 - 20 mg/dL   Creatinine, Ser 0.80 0.44 - 1.00 mg/dL   Calcium 8.6 (L) 8.9 - 10.3 mg/dL   GFR calc non Af Amer >60 >60 mL/min   GFR calc Af Amer >60 >60 mL/min   Anion gap 11 5 - 15    Studies/Results: No results found.    Assessment: Crohn's disease with partial small bowel obstruction which clinically seems to be improving  Plan:   Continue NG suction for another 24 hours. Continue intravenous Solu-Medrol. If she is doing better tomorrow I would recommend clamping the NG tube and seeing how she does. Perhaps trying some clear liquids.    Krystal [name] 11/15/2014, 12:53 PM  Pager: [phone] If no answer or after 5 PM call [phone]

## 2014-11-15 NOTE — Progress Notes (Signed)
Subjective: Reports that she is feeling better today. Passing flatus. No BM yet. Reports some sob. On 2L by .   Objective: Vital signs in last 24 hours: Filed Vitals:   11/15/14 0520 11/15/14 0900 11/15/14 0919 11/15/14 1130  BP: 92/57     Pulse: 88 73 86   Temp: 98 F (36.7 C)     TempSrc: Oral     Resp: 18     Height:    5\' 2"  (1.575 m)  SpO2: 93% 90% 93%    Weight change:   Intake/Output Summary (Last 24 hours) at 11/15/14 1258 Last data filed at 11/15/14 0730  Gross per 24 hour  Intake   2940 ml  Output    800 ml  Net   2140 ml   General: resting in bed, in no acute distress, non diaphoretic, NG tube in place, draining dark bloody fluid  Cardiac: RRR, no rubs, murmurs or gallops Pulm: clear to auscultation bilaterally, no crackles, wheezes, or rhonchi, moving normal volumes of air Abd: mildly distended, tympanic, + BS in all 4 quadrants, nontender, skin discoloration below umbilicus consistent with previous exam Ext: warm and well perfused, no pedal edema, 2+ pulses    Lab Results: Basic Metabolic Panel:  Recent Labs  11/14/14 0322 11/15/14 0443  NA 141 145  K 3.6 4.1  CL 106 112*  CO2 25 22  GLUCOSE 112* 107*  BUN 9 17  CREATININE 0.78 0.80  CALCIUM 8.0* 8.6*    Medications: I have reviewed the patient's current medications. Scheduled Meds: . ciprofloxacin  400 mg Intravenous Q12H  . enoxaparin (LOVENOX) injection  40 mg Subcutaneous Q24H  . methylPREDNISolone (SOLU-MEDROL) injection  80 mg Intravenous Q6H  . metronidazole  500 mg Intravenous Q8H   Continuous Infusions: . dextrose 5 % and 0.45% NaCl 75 mL/hr at 11/15/14 0732   PRN Meds:.acetaminophen, ondansetron (ZOFRAN) IV, promethazine, sodium chloride Assessment/Plan: Principal Problem:   Partial small bowel obstruction Active Problems:   Crohn's colitis   Reactive thrombocytosis   Long-term current use of methadone for opiate dependence   Ms. Cervantes is a 54 yo F with a h/o  diagnosed Crohn's disease, not on any therapy, who presents with abdominal pain, nausea, and anorexia. CT abdomen showed findings consistent with active Crohn's disease in the distal small bowel and a partial small bowel obstruction, due to either active inflammation vs stricture from chronic uncontrolled Crohn's. Passing flatus today, abdominal distention much improved.   Chron's colitis Pt with Crohn's diagnosis since 2011, not on any therapy at home. CT abdomen consistent with active Chron's disease throughout distal small bowel, including the terminal ileum and cecum. CRP elevated at 14.4. Leukocytosis on CBC likely due to demargination s/p IV steroids. Thrombocytopenia on CBC is most likely reactive.  - GI consulted, recs to continue IV steroids, abx, NG suction until tomorrow. Can try clamping NG tube tomorrow, trial of clears.  - cnt IV Solumedrol - cnt IV Ciprofloxacin, Flagyl  - NPO - NG suction  - Phenergan, Zofran for nausea prn  - Tylenol prn for pain  - f/u on GI pathogen panel to r/o coinfection as reason for flare   Partial SBO Visualized on CT. Patient is passing flatus today. Her abdominal distention is much improved. +BS today. No BM since admission. NG output decreased today at 188ml. No evidence of peritoneal signs or concern for perforation at this point.  - surgery consulted: will continue to monitor, conservative medical management for now  - GI  consulted, appreciate recs: NG suction for additional 24 hrs, if doing better tomorrow--> clamp NG tube, trial of clears  - continued NG suction today - NPO  Current use of methadone for opiate dependence Patient is on 75 methadone for h/o opioid dependence. Follows with [facility]. Has been on current regimen for 2 years. Methadone might be playing a role in her h/o chronic constipation.  - will d/c methadone and start on a short acting opioid for control of pain/withdrawal symptoms   SOB Patient with  subjective dyspnea since admission. Currently on 2L by [place], with saturations in the low 90s. Denies chest pain, leg pain or swelling. VSS. Most likely due to atelectasis in setting of immobilization, pain.  -IS -OOB to chair -ambulate   DVT Ppx -d/c heparin, transition to Lovenox   FENGI: -NPO -D5 1/2NS @ 75 - Replete electrolytes as needed.   Dispo: Awaiting improvement of medical problems. Anticipated discharge in 2-3 days.  This is a Careers information officer Note.  The care of the patient was discussed with Dr. Gordy Levan and the assessment and plan formulated with their assistance.  Please see their attached note for official documentation of the daily encounter.   LOS: 2 days   [name], Med Student 11/15/2014, 12:58 PM

## 2014-11-15 NOTE — Progress Notes (Signed)
Subjective:   Day of hospitalization: 2  VSS.  No overnight events.  Pt is feeling better, less abdominal pain.  No N/V.  Passing flatus but no BM.  NGT output 571ml yesterday.  She also c/o dyspnea overnight and was put on O2.  Denies CP or LE swelling.     Objective:   Vital signs in last 24 hours: Filed Vitals:   11/14/14 2123 11/15/14 0520 11/15/14 0900 11/15/14 0919  BP: 94/51 92/57    Pulse: 84 88 73 86  Temp: 97.9 F (36.6 C) 98 F (36.7 C)    TempSrc: Oral Oral    Resp: 17 18    SpO2: 93% 93% 90% 93%    Weight: There were no vitals filed for this visit.  I/Os:  Intake/Output Summary (Last 24 hours) at 11/15/14 1103 Last data filed at 11/15/14 0730  Gross per 24 hour  Intake   2940 ml  Output    800 ml  Net   2140 ml    Physical Exam: Constitutional: Vital signs reviewed.  Patient is lying in bed in no acute distress and cooperative with exam.   HEENT: [place]/AT; PERRL, EOMI, conjunctivae normal, no scleral icterus ; NGT in place.  Cardiovascular: RRR, no MRG Pulmonary/Chest: normal respiratory effort, no accessory muscle use, CTAB, no wheezes, rales, or rhonchi Abdominal: Improved distention and increased BS compare with yesterday,  still with mild diffuse tenderness, no rebound or guarding; redness over the lower abdomen; mild umbilical hernia Neurological: A&O x3, CN II-XII grossly intact; non-focal exam Extremities: 2+DP b/l, no C/C/E  Skin: Warm, dry and intact. No rash  Lab Results:  BMP:  Recent Labs Lab 11/14/14 0322 11/15/14 0443  NA 141 145  K 3.6 4.1  CL 106 112*  CO2 25 22  GLUCOSE 112* 107*  BUN 9 17  CREATININE 0.78 0.80  CALCIUM 8.0* 8.6*    CBC:  Recent Labs Lab 11/13/14 0258 11/13/14 1847 11/14/14 0322  WBC 9.9 14.7* 18.3*  NEUTROABS 8.2*  --   --   HGB 11.5* 10.8* 10.1*  HCT 34.5* 31.9* 30.0*  MCV 79.7 78.6 78.7  PLT 590* 412* 422*   LFTs:  Recent Labs Lab 11/13/14 0258 11/14/14 0322  AST 20 20  ALT 17 19    ALKPHOS 124 73  BILITOT 0.5 0.4  PROT 6.5 5.4*  ALBUMIN 2.7* 1.9*    Pancreatic Enzymes: No results for input(s): LIPASE, AMYLASE in the last 168 hours.  Lactic Acid/Procalcitonin:  Recent Labs Lab 11/13/14 0308 11/13/14 0520  LATICACIDVEN 1.61 1.61   Cardiac Enzymes: No results for input(s): CKTOTAL, CKMB, CKMBINDEX, TROPONINI in the last 168 hours.  EKG: EKG Interpretation  Date/Time:    Ventricular Rate:    PR Interval:    QRS Duration:   QT Interval:    QTC Calculation:   R Axis:     Text Interpretation:    Urinalysis:  Recent Labs Lab 11/13/14 0830  COLORURINE YELLOW  LABSPEC <1.005*  PHURINE 5.5  GLUCOSEU NEGATIVE  HGBUR TRACE*  BILIRUBINUR NEGATIVE  KETONESUR NEGATIVE  PROTEINUR NEGATIVE  UROBILINOGEN 0.2  NITRITE NEGATIVE  LEUKOCYTESUR NEGATIVE    Micro Results: No results found for this or any previous visit (from the past 240 hour(s)).  Blood Culture: No results found for: SDES, SPECREQUEST, CULT, REPTSTATUS  Studies/Results: No results found.  Medications:  Scheduled Meds: . ciprofloxacin  400 mg Intravenous Q12H  . methylPREDNISolone (SOLU-MEDROL) injection  80 mg Intravenous Q6H  .  metronidazole  500 mg Intravenous Q8H   Continuous Infusions: . dextrose 5 % and 0.45% NaCl 75 mL/hr at 11/15/14 0732   PRN Meds: acetaminophen, ondansetron (ZOFRAN) IV, promethazine, sodium chloride  Antibiotics: Antibiotics Given (last 72 hours)    None      Day of Hospitalization: 2  Consults: Treatment Team:  Ronald Lobo, MD Wonda Horner, MD  Assessment/Plan:   Principal Problem:   Partial small bowel obstruction Active Problems:   Crohn's colitis   Reactive thrombocytosis   Long-term current use of methadone for opiate dependence  Partial SBO Pt feels better today.  Endorses flatus but no BM.  BS and distention improved today.  NGT still putting out dark liquid.  Afebrile.  Leukocytosis yesterday likely d/t steroids.   Unclear if SBO related to inflammation or from fixed stricture.  Additionally, pt is on chronic methadone for a vicodin addiction? which is clearly not helping her SBO.  We have been giving her the dose of methadone daily that her clinic had her on.  We discussed with her that we would prefer to put her on something more short acting that would help keep her from withdrawing but would also be a better option for her SBO.   -cont to monitor  -surgery consulted, recommended conservative mgmt for now  -cont NGT suction for a couple more days per GI  -will d/c methadone and add scheduled morphine   Chronic constipation  Pt takes miralax daily to help with constipation likely induced by chronic methadone.  Is concerned that she has not had a BM since admission.   -will ask GI if there is something she can get for constipation   Dyspnea  Likely d/t atx from lying in bed and not moving around much with the NGT.  She is afebrile without tachycardia or LE swelling.  Doubt PNA given afebrile and PE unlikely as pt has been on anticoagulation without tachycardia.  O2 [place] PRN.  -IS -mobilize to chair and ambulate   Crohn's colitis Pt has presumed Crohn's and didn't follow up Dr. Cristina Gong.  CT Abd shows finding c/w active crohn's and partial SBO.  CRP elevated.  Pt on cipro/flagyl and solumedrol.  Leukocytosis likely d/t steroids.    -cont NPO and NGT suction  -phenergan for nausea -appreciate GI, surgery recs  -cont IV solumedrol, zofran, zofran  -GI pathogen panel to r/o co-infection as reason for flare -cont IV cipro/flagyl   Long term use of methadone for opioid addiction Has been on methadone 75mg  daily for 2 years prescribed by [facility].   -will d/c methadone dose and add morphine since this is not helping her SBO   Tthrombocytosis Likely reactive d/t ongoing inflammation/infection.   -cont to monitor   F/E/N Fluids- change IVF to D5-1/2NS@75ml /h   Electrolytes- Replete as needed    Nutrition- NPO  VTE PPx  -d/c heparin 5000 units tid   -add lovenox   Disposition Disposition is deferred, awaiting improvement of current medical problems.  Anticipated discharge in approximately 1-2 day(s).     LOS: 2 days   Jones Bales, MD PGY-2, Internal Medicine Teaching Service 11/15/2014, 11:03 AM

## 2014-11-16 DIAGNOSIS — K5909 Other constipation: Secondary | ICD-10-CM

## 2014-11-16 DIAGNOSIS — R0600 Dyspnea, unspecified: Secondary | ICD-10-CM

## 2014-11-16 LAB — BASIC METABOLIC PANEL
Anion gap: 9 (ref 5–15)
BUN: 23 mg/dL — ABNORMAL HIGH (ref 6–20)
CALCIUM: 8.8 mg/dL — AB (ref 8.9–10.3)
CO2: 25 mmol/L (ref 22–32)
CREATININE: 0.75 mg/dL (ref 0.44–1.00)
Chloride: 109 mmol/L (ref 101–111)
GFR calc Af Amer: >60 mL/min (ref >60)
GFR calc non Af Amer: >60 mL/min (ref >60)
Glucose, Bld: 144 mg/dL — ABNORMAL HIGH (ref 65–99)
Potassium: 4.3 mmol/L (ref 3.5–5.1)
Sodium: 143 mmol/L (ref 135–145)

## 2014-11-16 NOTE — Progress Notes (Signed)
Subjective: Report that she is feeling well. She ambulated outside the room, but had some sob with ambulation and was back on the [place]. Denies any abdominal pain. Still passing gas, but no BM yet.   Objective: Vital signs in last 24 hours: Filed Vitals:   11/15/14 1307 11/15/14 1508 11/15/14 2129 11/16/14 0542  BP: 99/55  101/60 114/71  Pulse: 64  68 66  Temp: 97.5 F (36.4 C)  97.7 F (36.5 C) 97.6 F (36.4 C)  TempSrc: Oral  Oral Oral  Resp: 17  18 19   Height:      Weight:  49.624 kg (109 lb 6.4 oz)  49.88 kg (109 lb 15.5 oz)  SpO2: 92%  93% 90%    Intake/Output Summary (Last 24 hours) at 11/16/14 1056 Last data filed at 11/16/14 0600  Gross per 24 hour  Intake   2385 ml  Output   1400 ml  Net    985 ml    General: VS reviewed, sitting up in chair, in no acute distress, non diaphoretic, NG tube in place, draining dark bloody fluid  Cardiac: RRR, no rubs, murmurs or gallops Pulm: clear to auscultation bilaterally, no crackles, wheezes, or rhonchi, moving normal volumes of air Abd: mildly distended, tympanic, no BS, nontender, skin discoloration below umbilicus consistent with previous exam Ext: warm and well perfused, no pedal edema, 2+ pulses   Lab Results: Basic Metabolic Panel:  Recent Labs  11/15/14 0443 11/16/14 0417  NA 145 143  K 4.1 4.3  CL 112* 109  CO2 22 25  GLUCOSE 107* 144*  BUN 17 23*  CREATININE 0.80 0.75  CALCIUM 8.6* 8.8*   Medications: I have reviewed the patient's current medications. Scheduled Meds: . ciprofloxacin  400 mg Intravenous Q12H  . enoxaparin (LOVENOX) injection  40 mg Subcutaneous Q24H  .  HYDROmorphone (DILAUDID) injection  2 mg Intravenous 6 times per day  . methylPREDNISolone (SOLU-MEDROL) injection  80 mg Intravenous Q6H  . metronidazole  500 mg Intravenous Q8H   Continuous Infusions: . dextrose 5 % and 0.45% NaCl 100 mL/hr at 11/16/14 0735   PRN Meds:.acetaminophen, promethazine, sodium  chloride Assessment/Plan: Principal Problem:   Partial small bowel obstruction Active Problems:   Crohn's colitis   Reactive thrombocytosis   Long-term current use of methadone for opiate dependence  Ms. Ugarte is a 54 yo F with a h/o diagnosed Crohn's disease, not on any therapy, who presents with abdominal pain, nausea, and anorexia. CT abdomen showed findings consistent with active Crohn's disease in the distal small bowel and a partial small bowel obstruction, due to either active inflammation vs stricture from chronic uncontrolled Crohn's. Passing flatus, abdominal distention much improved.   Chron's colitis Pt with Crohn's diagnosis since 2011, not on any therapy at home. CT abdomen consistent with active Chron's disease throughout distal small bowel, including the terminal ileum and cecum. CRP elevated at 14.4. Leukocytosis on CBC likely due to demargination s/p IV steroids. Thrombocytopenia on CBC is most likely reactive.  - GI consulted, appreciate recs - Will try clamping NG tube,  If she does well with that, will have her do a trial of clears.  - cnt IV Solumedrol - cnt IV Ciprofloxacin, Flagyl  - Phenergan, Zofran for nausea prn  - Tylenol prn for pain  - f/u on GI pathogen panel to r/o coinfection as reason for flare   Partial SBO Visualized on CT. Patient is passing flatus today. Her abdominal distention is much improved. No BS. No BM  since admission. NG output O/N at 150 ml. No evidence of peritoneal signs or concern for perforation at this point.  - surgery consulted: will continue to monitor, conservative medical management for now  - Will try clamping NG tube today, trial of clears later today if she does well  - continued NG suction today - NPO  Current use of methadone for opiate dependence Patient is on 75 methadone for h/o opioid dependence. Follows with [facility]. Has been on current regimen for 2 years. Methadone might be playing a role in  her h/o chronic constipation.  - d/ced methadone -started on dilaudid 2mg  q4hr   SOB Patient with subjective dyspnea since admission. Currently on 2L by , with saturations in the low 90s. Denies chest pain, leg pain or swelling. VSS. Most likely due to atelectasis in setting of immobilization, pain.  - IS -OOB to chair -ambulate  - wean O2 as tolerated   DVT Ppx -lovenox 40 subq  FENGI: -NPO -D5 1/2NS @ 75 - Replete electrolytes as needed.    This is a Careers information officer Note.  The care of the patient was discussed with Dr. Gordy Levan and the assessment and plan formulated with their assistance.  Please see their attached note for official documentation of the daily encounter.   LOS: 3 days   [name], Med Student 11/16/2014, 10:56 AM

## 2014-11-16 NOTE — Progress Notes (Signed)
Pt has tolerated clamped NG tube all day without nausea or vomiting.

## 2014-11-16 NOTE — Progress Notes (Signed)
Subjective:   Day of hospitalization: 3  VSS.  No overnight events.  Pt is feeling better, less abdominal pain.  No N/V.  Passing flatus and feels like she has to have a BM.  NGT output 321ml yesterday.  She still has some SOB.   Denies CP or LE swelling.     Objective:   Vital signs in last 24 hours: Filed Vitals:   11/15/14 1307 11/15/14 1508 11/15/14 2129 11/16/14 0542  BP: 99/55  101/60 114/71  Pulse: 64  68 66  Temp: 97.5 F (36.4 C)  97.7 F (36.5 C) 97.6 F (36.4 C)  TempSrc: Oral  Oral Oral  Resp: 17  18 19   Height:      Weight:  49.624 kg (109 lb 6.4 oz)  49.88 kg (109 lb 15.5 oz)  SpO2: 92%  93% 90%    Weight: Filed Weights   11/15/14 1508 11/16/14 0542  Weight: 49.624 kg (109 lb 6.4 oz) 49.88 kg (109 lb 15.5 oz)    I/Os:  Intake/Output Summary (Last 24 hours) at 11/16/14 1107 Last data filed at 11/16/14 0600  Gross per 24 hour  Intake   2385 ml  Output   1400 ml  Net    985 ml    Physical Exam: Constitutional: Vital signs reviewed.  Patient is lying in bed in no acute distress and cooperative with exam.   HEENT: [place]/AT; PERRL, EOMI, conjunctivae normal, no scleral icterus ; NGT in place.  Cardiovascular: RRR, no MRG Pulmonary/Chest: normal respiratory effort, no accessory muscle use, CTAB, no wheezes, rales, or rhonchi Abdominal: Improved distention and still decreased BM,  still with mild diffuse tenderness, no rebound or guarding; redness over the lower abdomen; mild umbilical hernia Neurological: A&O x3, CN II-XII grossly intact; non-focal exam Extremities: 2+DP b/l, no C/C/E  Skin: Warm, dry and intact. No rash  Lab Results:  BMP:  Recent Labs Lab 11/15/14 0443 11/16/14 0417  NA 145 143  K 4.1 4.3  CL 112* 109  CO2 22 25  GLUCOSE 107* 144*  BUN 17 23*  CREATININE 0.80 0.75  CALCIUM 8.6* 8.8*    CBC:  Recent Labs Lab 11/13/14 0258 11/13/14 1847 11/14/14 0322  WBC 9.9 14.7* 18.3*  NEUTROABS 8.2*  --   --   HGB 11.5* 10.8*  10.1*  HCT 34.5* 31.9* 30.0*  MCV 79.7 78.6 78.7  PLT 590* 412* 422*   LFTs:  Recent Labs Lab 11/13/14 0258 11/14/14 0322  AST 20 20  ALT 17 19  ALKPHOS 124 73  BILITOT 0.5 0.4  PROT 6.5 5.4*  ALBUMIN 2.7* 1.9*    Pancreatic Enzymes: No results for input(s): LIPASE, AMYLASE in the last 168 hours.  Lactic Acid/Procalcitonin:  Recent Labs Lab 11/13/14 0308 11/13/14 0520  LATICACIDVEN 1.61 1.61   Cardiac Enzymes: No results for input(s): CKTOTAL, CKMB, CKMBINDEX, TROPONINI in the last 168 hours.  EKG: EKG Interpretation  Date/Time:    Ventricular Rate:    PR Interval:    QRS Duration:   QT Interval:    QTC Calculation:   R Axis:     Text Interpretation:    Urinalysis:  Recent Labs Lab 11/13/14 0830  COLORURINE YELLOW  LABSPEC <1.005*  PHURINE 5.5  GLUCOSEU NEGATIVE  HGBUR TRACE*  BILIRUBINUR NEGATIVE  KETONESUR NEGATIVE  PROTEINUR NEGATIVE  UROBILINOGEN 0.2  NITRITE NEGATIVE  LEUKOCYTESUR NEGATIVE    Micro Results: No results found for this or any previous visit (from the past 240 hour(s)).  Blood Culture: No results found for: SDES, SPECREQUEST, CULT, REPTSTATUS  Studies/Results: No results found.  Medications:  Scheduled Meds: . ciprofloxacin  400 mg Intravenous Q12H  . enoxaparin (LOVENOX) injection  40 mg Subcutaneous Q24H  .  HYDROmorphone (DILAUDID) injection  2 mg Intravenous 6 times per day  . methylPREDNISolone (SOLU-MEDROL) injection  80 mg Intravenous Q6H  . metronidazole  500 mg Intravenous Q8H   Continuous Infusions: . dextrose 5 % and 0.45% NaCl 100 mL/hr at 11/16/14 0735   PRN Meds: acetaminophen, promethazine, sodium chloride  Antibiotics: Antibiotics Given (last 72 hours)    None      Day of Hospitalization: 3  Consults: Treatment Team:  Ronald Lobo, MD Wonda Horner, MD  Assessment/Plan:   Principal Problem:   Partial small bowel obstruction Active Problems:   Crohn's colitis   Reactive  thrombocytosis   Long-term current use of methadone for opiate dependence  Partial SBO Pt feels better today.  Endorses flatus but no BM.  Still with decreased BS but distention improved today.  NGT still putting out dark liquid.  Afebrile.  Unclear if SBO related to inflammation or from fixed stricture.  Additionally, pt is on chronic methadone for a vicodin addiction? which is clearly not helping her SBO.  -cont to monitor  -surgery consulted, recommended conservative mgmt for now  -will clamp NGT today and try on clears  -cont scheduled dilaudid    Chronic constipation  Pt takes miralax daily to help with constipation likely induced by chronic methadone.  Is concerned that she has not had a BM since admission.   -spoke with GI and no intervention required now   Dyspnea  Somewhat improved but still SOB.  Likely d/t atx but also the NGT may be contributing.  She is afebrile without tachycardia or LE swelling.  Doubt PNA given afebrile and PE unlikely as pt has been on anticoagulation without tachycardia.  O2 [place] PRN.  She has been using IS and ambulating.   -IS -mobilize to chair and ambulate   Crohn's colitis Pt has presumed Crohn's and didn't follow up Dr. Cristina Gong.  CT Abd shows finding c/w active crohn's and partial SBO.  CRP elevated.  Pt on cipro/flagyl and solumedrol.  Leukocytosis likely d/t steroids.    -clamp NGT and try clears today  -phenergan for nausea -appreciate GI, surgery recs  -cont IV solumedrol, zofran, zofran  -cont IV cipro/flagyl   Long term use of methadone for opioid addiction Has been on methadone 75mg  daily for 2 years prescribed by [facility].   -will d/c methadone dose and added dilaudid 2mg  q4h as methadone has a much longer half life    Tthrombocytosis Likely reactive d/t ongoing inflammation/infection.   -cont to monitor   F/E/N Fluids- cont D5-1/2NS@100ml /h   Electrolytes- Replete as needed  Nutrition- Clear liquids   VTE PPx  -cont  lovenox   Disposition Disposition is deferred, awaiting improvement of current medical problems.  Anticipated discharge in approximately 1-2 day(s).     LOS: 3 days   Jones Bales, MD PGY-2, Internal Medicine Teaching Service 11/16/2014, 11:07 AM

## 2014-11-16 NOTE — Progress Notes (Signed)
Patient ID: Krystal [name], female   DOB: 1960/06/30, 54 y.o.   MRN: [id number] [facility] Gastroenterology Progress Note  Krystal [name] 54 y.o. 1960/08/12   Subjective: Sitting in bedside chair. NGT clamped this morning and tolerating it. Feels rumbling in her abdomen but no flatus or BMs. No abdominal pain. Tolerating clear liquids. Good spirits. About to skype with her daughter in [place]. Multiple other family members in the room.  Objective: Vital signs in last 24 hours: Filed Vitals:   11/16/14 1316  BP: 117/75  Pulse: 63  Temp: 97.9 F (36.6 C)  Resp: 18  NG output 350 cc yesterday  Physical Exam: Gen: alert, no acute distress, thin Eyes: anicteric sclera, pupils equal and reactive CV: RRR Chest: CTA B Abd: mild distention, minimal diffuse tenderness with guarding, decreased bowel sounds  Lab Results:  Recent Labs  11/15/14 0443 11/16/14 0417  NA 145 143  K 4.1 4.3  CL 112* 109  CO2 22 25  GLUCOSE 107* 144*  BUN 17 23*  CREATININE 0.80 0.75  CALCIUM 8.6* 8.8*    Recent Labs  11/14/14 0322  AST 20  ALT 19  ALKPHOS 73  BILITOT 0.4  PROT 5.4*  ALBUMIN 1.9*    Recent Labs  11/13/14 1847 11/14/14 0322  WBC 14.7* 18.3*  HGB 10.8* 10.1*  HCT 31.9* 30.0*  MCV 78.6 78.7  PLT 412* 422*   No results for input(s): LABPROT, INR in the last 72 hours.    Assessment/Plan: Partial small bowel obstruction in the setting of Crohn's disease - pSBO improving on IV steroids. Tolerating NG clamping. Denies flatus or BMs but suspect she will have both tomorrow. Continue same dose of IV steroids for now. Recheck CBC tomorrow (elevated WBC likely due to steroids). Would keep NG clamped and not remove for now. Keep on clear liquids as tolerated. Will follow.   Blaine C. 11/16/2014, 4:49 PM  Pager [phone]  If no answer or after 5 PM call [phone]

## 2014-11-17 ENCOUNTER — Inpatient Hospital Stay (HOSPITAL_COMMUNITY): Payer: Managed Care, Other (non HMO)

## 2014-11-17 LAB — CBC WITH DIFFERENTIAL/PLATELET
BASOS ABS: 0 10*3/uL (ref 0.0–0.1)
BASOS PCT: 0 % (ref 0–1)
Eosinophils Absolute: 0 10*3/uL (ref 0.0–0.7)
Eosinophils Relative: 0 % (ref 0–5)
HCT: 30.8 % — ABNORMAL LOW (ref 36.0–46.0)
Hemoglobin: 10.3 g/dL — ABNORMAL LOW (ref 12.0–15.0)
Lymphocytes Relative: 4 % — ABNORMAL LOW (ref 12–46)
Lymphs Abs: 0.9 10*3/uL (ref 0.7–4.0)
MCH: 26.1 pg (ref 26.0–34.0)
MCHC: 33.4 g/dL (ref 30.0–36.0)
MCV: 78 fL (ref 78.0–100.0)
MONO ABS: 0.8 10*3/uL (ref 0.1–1.0)
MONOS PCT: 4 % (ref 3–12)
NEUTROS ABS: 19.2 10*3/uL — AB (ref 1.7–7.7)
NEUTROS PCT: 92 % — AB (ref 43–77)
PLATELETS: 446 10*3/uL — AB (ref 150–400)
RBC: 3.95 MIL/uL (ref 3.87–5.11)
RDW: 16.6 % — AB (ref 11.5–15.5)
WBC: 21 10*3/uL — AB (ref 4.0–10.5)

## 2014-11-17 MED ORDER — ONDANSETRON HCL 4 MG/2ML IJ SOLN
4.0000 mg | Freq: Four times a day (QID) | INTRAMUSCULAR | Status: DC | PRN
  Administered 2014-11-17 (×3): 4 mg via INTRAVENOUS
  Filled 2014-11-17 (×3): qty 2

## 2014-11-17 MED ORDER — METHYLPREDNISOLONE SODIUM SUCC 125 MG IJ SOLR
80.0000 mg | Freq: Two times a day (BID) | INTRAMUSCULAR | Status: DC
Start: 2014-11-17 — End: 2014-11-20
  Administered 2014-11-17 – 2014-11-20 (×6): 80 mg via INTRAVENOUS
  Filled 2014-11-17 (×6): qty 2

## 2014-11-17 NOTE — Progress Notes (Signed)
Subjective:   Day of hospitalization: 4  VSS.  Pt had several loose BM last evening and didn't sleep well.  She also had some abd cramping with BM.  Overall, she is feeling better but does get nauseated after flagyl.  No N/V.  NGT clamped yesterday and tolerating clears.  Had 50cc output yesterday from NGT.  She still has some SOB.   Denies CP or LE swelling.     Objective:   Vital signs in last 24 hours: Filed Vitals:   11/16/14 0542 11/16/14 1316 11/16/14 2143 11/17/14 0517  BP: 114/71 117/75 109/69 133/64  Pulse: 66 63 53 56  Temp: 97.6 F (36.4 C) 97.9 F (36.6 C) 97.7 F (36.5 C) 97.5 F (36.4 C)  TempSrc: Oral Oral Axillary Oral  Resp: 19 18 17 18   Height:      Weight: 49.88 kg (109 lb 15.5 oz)     SpO2: 90% 92% 90% 96%    Weight: Filed Weights   11/15/14 1508 11/16/14 0542  Weight: 49.624 kg (109 lb 6.4 oz) 49.88 kg (109 lb 15.5 oz)    I/Os:  Intake/Output Summary (Last 24 hours) at 11/17/14 1211 Last data filed at 11/17/14 0657  Gross per 24 hour  Intake 2557.08 ml  Output    350 ml  Net 2207.08 ml    Physical Exam: Constitutional: Vital signs reviewed.  Patient is lying in bed in no acute distress and cooperative with exam.   HEENT: [place]/AT; PERRL, EOMI, conjunctivae normal, no scleral icterus ; NGT in place.  Cardiovascular: RRR, no MRG Pulmonary/Chest: normal respiratory effort, no accessory muscle use, CTAB, no wheezes, rales, or rhonchi Abdominal: Improved distention and +BS,  still with mild diffuse tenderness, no rebound or guarding; redness over the lower abdomen; mild umbilical hernia Neurological: A&O x3, CN II-XII grossly intact; non-focal exam Extremities: 2+DP b/l, no C/C/E  Skin: Warm, dry and intact. No rash  Lab Results:  BMP:  Recent Labs Lab 11/15/14 0443 11/16/14 0417  NA 145 143  K 4.1 4.3  CL 112* 109  CO2 22 25  GLUCOSE 107* 144*  BUN 17 23*  CREATININE 0.80 0.75  CALCIUM 8.6* 8.8*    CBC:  Recent Labs Lab  11/13/14 0258  11/14/14 0322 11/17/14 0330  WBC 9.9  < > 18.3* 21.0*  NEUTROABS 8.2*  --   --  19.2*  HGB 11.5*  < > 10.1* 10.3*  HCT 34.5*  < > 30.0* 30.8*  MCV 79.7  < > 78.7 78.0  PLT 590*  < > 422* 446*  < > = values in this interval not displayed. LFTs:  Recent Labs Lab 11/13/14 0258 11/14/14 0322  AST 20 20  ALT 17 19  ALKPHOS 124 73  BILITOT 0.5 0.4  PROT 6.5 5.4*  ALBUMIN 2.7* 1.9*    Pancreatic Enzymes: No results for input(s): LIPASE, AMYLASE in the last 168 hours.  Lactic Acid/Procalcitonin:  Recent Labs Lab 11/13/14 0308 11/13/14 0520  LATICACIDVEN 1.61 1.61   Cardiac Enzymes: No results for input(s): CKTOTAL, CKMB, CKMBINDEX, TROPONINI in the last 168 hours.  EKG: EKG Interpretation  Date/Time:    Ventricular Rate:    PR Interval:    QRS Duration:   QT Interval:    QTC Calculation:   R Axis:     Text Interpretation:    Urinalysis:  Recent Labs Lab 11/13/14 0830  COLORURINE YELLOW  LABSPEC <1.005*  PHURINE 5.5  GLUCOSEU NEGATIVE  HGBUR TRACE*  BILIRUBINUR  NEGATIVE  KETONESUR NEGATIVE  PROTEINUR NEGATIVE  UROBILINOGEN 0.2  NITRITE NEGATIVE  LEUKOCYTESUR NEGATIVE    Micro Results: No results found for this or any previous visit (from the past 240 hour(s)).  Blood Culture: No results found for: SDES, SPECREQUEST, CULT, REPTSTATUS  Studies/Results: No results found.  Medications:  Scheduled Meds: . ciprofloxacin  400 mg Intravenous Q12H  . enoxaparin (LOVENOX) injection  40 mg Subcutaneous Q24H  .  HYDROmorphone (DILAUDID) injection  2 mg Intravenous 6 times per day  . methylPREDNISolone (SOLU-MEDROL) injection  80 mg Intravenous Q6H  . metronidazole  500 mg Intravenous Q8H   Continuous Infusions: . dextrose 5 % and 0.45% NaCl 100 mL/hr at 11/17/14 0554   PRN Meds: acetaminophen, ondansetron (ZOFRAN) IV, sodium chloride  Antibiotics: Antibiotics Given (last 72 hours)    None      Day of  Hospitalization: 4  Consults: Treatment Team:  Ronald Lobo, MD Wonda Horner, MD  Assessment/Plan:   Principal Problem:   Partial small bowel obstruction Active Problems:   Crohn's colitis   Reactive thrombocytosis   Long-term current use of methadone for opiate dependence  Partial SBO Pt continues to improve.  Had several BM overnight.  Active BS today with distention improved.  NGT remains clamped  Afebrile.  Unclear if SBO related to inflammation or from fixed stricture.  Additionally, pt is on chronic methadone for a vicodin addiction? which is clearly not helping her SBO.   -cont to monitor  -surgery and GI consulted, recommended conservative mgmt for now  -cont with clears today  -cont scheduled dilaudid   -cont IV solumedrol, but will need to start weaning down   Crohn's colitis Pt has presumed Crohn's and didn't follow up Dr. Cristina Gong.  CT Abd shows finding c/w active crohn's and partial SBO.  CRP elevated.  Pt on cipro/flagyl and solumedrol.  Leukocytosis likely d/t steroids.    -cont NGT clamped  -zofran for nausea -appreciate GI, surgery recs  -cont IV solumedrol  -d/c IV abx   Chronic constipation  Pt takes miralax daily to help with constipation likely induced by chronic methadone, but has not taken it here.  Had several BM overnight.    -cont to monitor    Dyspnea  Somewhat improved but still SOB.  Likely d/t atx but also the NGT may be contributing.  She is afebrile without tachycardia or LE swelling.  Doubt PNA given afebrile and PE unlikely as pt has been on anticoagulation without tachycardia.  O2 [place] PRN.  She has been using IS and ambulating.   -IS -mobilize to chair and ambulate   Long term use of methadone for opioid addiction Has been on methadone 75mg  daily for 2 years prescribed by [facility].   -cont dilaudid 2mg  q4h as methadone has a much longer half life    Tthrombocytosis Likely reactive d/t ongoing inflammation/infection.   -cont  to monitor   F/E/N Fluids- None Electrolytes- Replete as needed  Nutrition- Clear liquids   VTE PPx  -cont lovenox   Disposition Disposition is deferred, awaiting improvement of current medical problems.  Anticipated discharge in approximately 1-2 day(s).     LOS: 4 days   Jones Bales, MD PGY-2, Internal Medicine Teaching Service 11/17/2014, 12:11 PM

## 2014-11-17 NOTE — Progress Notes (Signed)
Patient ID: Krystal [name], female   DOB: 21-Feb-1961, 54 y.o.   MRN: [id number] [facility] Progress Note  Krystal [name] 54 y.o. [birth year]   Subjective: Feels better. Reports 4 soft to loose stools overnight. Tolerated clamping of NG yesterday without nausea or vomiting.  Objective: Vital signs in last 24 hours: Filed Vitals:   11/17/14 0517  BP: 133/64  Pulse: 56  Temp: 97.5 F (36.4 C)  Resp: 18    Physical Exam: Gen: alert, no acute distress, thin CV: RRR Chest: CTA B Abd: mild distention, mild diffuse tenderness with guarding, +BS  Lab Results:  Recent Labs  11/15/14 0443 11/16/14 0417  NA 145 143  K 4.1 4.3  CL 112* 109  CO2 22 25  GLUCOSE 107* 144*  BUN 17 23*  CREATININE 0.80 0.75  CALCIUM 8.6* 8.8*   No results for input(s): AST, ALT, ALKPHOS, BILITOT, PROT, ALBUMIN in the last 72 hours.  Recent Labs  11/17/14 0330  WBC 21.0*  NEUTROABS 19.2*  HGB 10.3*  HCT 30.8*  MCV 78.0  PLT 446*   No results for input(s): LABPROT, INR in the last 72 hours.    Assessment/Plan: Resolving partial SBO in the setting of Crohn's disease. Clinically she is getting better but prior to d/cing NG will get a repeat Xray. Continue clear liquids. Decrease IV Solumedrol to 80 mg Q 12 hours with plans to transition to Prednisone in the next 1-2 days. If Xray ok d/c NG tube and consider slowly advancing diet tomorrow. Dr. Oletta Lamas to see tomorrow.   [place]. 11/17/2014, 1:31 PM  Pager [phone]  If no answer or after 5 PM call [phone]

## 2014-11-17 NOTE — Progress Notes (Signed)
Spoke with Dr. Trudee Kuster about orders that were discontinued, IVF and antibiotics.  Pt has not taken in much orally today and urine is dark amber in color.  He said to contact Dr. Gordy Levan.  Texted Dr. Gordy Levan about orders.

## 2014-11-17 NOTE — Progress Notes (Signed)
Abd xray report called to Dr. Michail Sermon, will advance NGT 10-15 cm and reconnect to suction(low intermittent).  Pt has felt more nauseated today and abd is tight.  She had 3-4 BMs last night but none today and really isnt passing any gas today.

## 2014-11-18 MED ORDER — KCL IN DEXTROSE-NACL 10-5-0.45 MEQ/L-%-% IV SOLN
INTRAVENOUS | Status: DC
  Administered 2014-11-18 – 2014-11-19 (×2): via INTRAVENOUS
  Filled 2014-11-18 (×4): qty 1000

## 2014-11-18 MED ORDER — DEXTROSE-NACL 5-0.45 % IV SOLN
INTRAVENOUS | Status: DC
  Administered 2014-11-18: 12:00:00 via INTRAVENOUS

## 2014-11-18 NOTE — Progress Notes (Signed)
Subjective:   Day of hospitalization: 5  VSS.  Had BM this AM.  NGT was advanced yesterday due to placement in esophagus.  Nausea and cramping resolved.  Concerned that her nurses understand that she is to receive scheduled dilaudid.     Objective:   Vital signs in last 24 hours: Filed Vitals:   11/17/14 0517 11/17/14 1342 11/17/14 2201 11/18/14 0600  BP: 133/64 140/78 134/72 132/66  Pulse: 56 55 53 58  Temp: 97.5 F (36.4 C) 97.3 F (36.3 C) 98.4 F (36.9 C) 97.8 F (36.6 C)  TempSrc: Oral Axillary Axillary   Resp: 18 16 15 16   Height:      Weight:      SpO2: 96% 94% 95% 92%    Weight: Filed Weights   11/15/14 1508 11/16/14 0542  Weight: 49.624 kg (109 lb 6.4 oz) 49.88 kg (109 lb 15.5 oz)    I/Os:  Intake/Output Summary (Last 24 hours) at 11/18/14 1240 Last data filed at 11/18/14 0840  Gross per 24 hour  Intake   1645 ml  Output   1600 ml  Net     45 ml    Physical Exam: Constitutional: Vital signs reviewed.  Patient is lying in bed in no acute distress and cooperative with exam.   HEENT: [place]/AT; EOMI, conjunctivae normal, no scleral icterus ; NGT in place.  Cardiovascular: RRR, no MRG Pulmonary/Chest: normal respiratory effort, no accessory muscle use, CTAB, no wheezes, rales, or rhonchi Abdominal: Improved distention and +BS,  still with mild diffuse tenderness, no rebound or guarding; redness over the lower abdomen; mild umbilical hernia Neurological: A&O x3, CN II-XII grossly intact; non-focal exam Extremities: 2+DP b/l, no C/C/E  Skin: Warm, dry and intact. No rash  Lab Results:  BMP:  Recent Labs Lab 11/15/14 0443 11/16/14 0417  NA 145 143  K 4.1 4.3  CL 112* 109  CO2 22 25  GLUCOSE 107* 144*  BUN 17 23*  CREATININE 0.80 0.75  CALCIUM 8.6* 8.8*    CBC:  Recent Labs Lab 11/13/14 0258  11/14/14 0322 11/17/14 0330  WBC 9.9  < > 18.3* 21.0*  NEUTROABS 8.2*  --   --  19.2*  HGB 11.5*  < > 10.1* 10.3*  HCT 34.5*  < > 30.0* 30.8*    MCV 79.7  < > 78.7 78.0  PLT 590*  < > 422* 446*  < > = values in this interval not displayed. LFTs:  Recent Labs Lab 11/13/14 0258 11/14/14 0322  AST 20 20  ALT 17 19  ALKPHOS 124 73  BILITOT 0.5 0.4  PROT 6.5 5.4*  ALBUMIN 2.7* 1.9*    Pancreatic Enzymes: No results for input(s): LIPASE, AMYLASE in the last 168 hours.  Lactic Acid/Procalcitonin:  Recent Labs Lab 11/13/14 0308 11/13/14 0520  LATICACIDVEN 1.61 1.61   Cardiac Enzymes: No results for input(s): CKTOTAL, CKMB, CKMBINDEX, TROPONINI in the last 168 hours.  EKG: EKG Interpretation  Date/Time:    Ventricular Rate:    PR Interval:    QRS Duration:   QT Interval:    QTC Calculation:   R Axis:     Text Interpretation:    Urinalysis:  Recent Labs Lab 11/13/14 0830  COLORURINE YELLOW  LABSPEC <1.005*  PHURINE 5.5  GLUCOSEU NEGATIVE  HGBUR TRACE*  BILIRUBINUR NEGATIVE  KETONESUR NEGATIVE  PROTEINUR NEGATIVE  UROBILINOGEN 0.2  NITRITE NEGATIVE  LEUKOCYTESUR NEGATIVE    Micro Results: No results found for this or any previous visit (  from the past 240 hour(s)).  Blood Culture: No results found for: SDES, SPECREQUEST, CULT, REPTSTATUS  Studies/Results: Dg Abd 2 Views  2014/12/10   CLINICAL DATA:  Right lower quadrant abdominal pain. Small-bowel obstruction. Crohn's disease.  EXAM: ABDOMEN - 2 VIEW  COMPARISON:  CT abdomen and pelvis 11/13/2014  FINDINGS: An enteric tube has been placed with tip projecting over the distal thoracic esophagus. No intraperitoneal free air is identified. There is moderate dilatation of multiple small bowel loops in the central and left abdomen measuring up to approximately 4.5 cm in diameter and overall stable to slightly more prominent than on the prior study. A small amount of gas is again seen in the colon.  Opacities in the lung bases likely represent small pleural effusions and subsegmental atelectasis.  IMPRESSION: 1. Enteric tube terminates in the distal  thoracic esophagus. Recommend advancing by approximately 10 cm (15 cm if side hole placement in the stomach is desired). 2. Persistent small bowel dilatation consistent with partial obstruction.   Electronically Signed   By: Logan Bores   On: Dec 10, 2014 16:24    Medications:  Scheduled Meds: . enoxaparin (LOVENOX) injection  40 mg Subcutaneous Q24H  .  HYDROmorphone (DILAUDID) injection  2 mg Intravenous 6 times per day  . methylPREDNISolone (SOLU-MEDROL) injection  80 mg Intravenous Q12H   Continuous Infusions: . dextrose 5 % and 0.45% NaCl 100 mL/hr at 11/18/14 1206   PRN Meds: acetaminophen, ondansetron (ZOFRAN) IV, sodium chloride  Antibiotics: Antibiotics Given (last 72 hours)    None      Day of Hospitalization: 5  Consults: Treatment Team:  Ronald Lobo, MD Wonda Horner, MD  Assessment/Plan:   Principal Problem:   Partial small bowel obstruction Active Problems:   Crohn's colitis   Reactive thrombocytosis   Long-term current use of methadone for opiate dependence  Partial SBO Pt continues to slowly mprove.  Had BM overnight.  Active BS today with distention improved.  NGT was clamped yesterday and was tolerating clears, but XR showed it was in the esophagus and therefore was advanced.  Unclear if SBO related to inflammation or from fixed stricture.  Pt is chronic methadone which is clearly not helping her.   -cont to monitor  -surgery and GI consulted, recommended conservative mgmt for now  -cont clamped NGT  -cont scheduled dilaudid   -cont IV solumedrol, but will need to start weaning down   Crohn's colitis Pt has presumed Crohn's and didn't follow up Dr. Cristina Gong.  CT Abd shows finding c/w active crohn's and partial SBO.  CRP elevated.  Pt cont on solumedrol.  Leukocytosis likely d/t steroids.    -cont NGT, may clamp for ambulation  -zofran for nausea -appreciate GI, surgery recs  -cont IV solumedrol bid -d/c IV abx   Chronic constipation  Pt takes  miralax daily to help with constipation likely induced by chronic methadone, but has not taken it here.  Had several BM overnight.    -cont to monitor    Dyspnea  Improved, is not required O2 currently.  Likely d/t atx but also the NGT may be contributing.  She is afebrile without tachycardia or LE swelling.  Doubt PNA given afebrile and PE unlikely as pt has been on anticoagulation without tachycardia.  O2  PRN.  She has been using IS and ambulating.   -IS -mobilize to chair and ambulate   Long term use of methadone for opioid addiction Has been on methadone 75mg  daily for 2 years prescribed  by [facility].   -cont dilaudid 2mg  q4h as methadone has a much longer half life    Tthrombocytosis Likely reactive d/t ongoing inflammation/infection.   -cont to monitor   F/E/N Fluids- D51/2NS @100 /h Electrolytes- Replete as needed  Nutrition- NPO  VTE PPx  -cont lovenox   Disposition Disposition is deferred, awaiting improvement of current medical problems.  Anticipated discharge in approximately 1-2 day(s).     LOS: 5 days   Jones Bales, MD PGY-2, Internal Medicine Teaching Service 11/18/2014, 12:40 PM

## 2014-11-18 NOTE — Progress Notes (Signed)
[facility] PROGRESS NOTE Subjective NG fell back into esophagus and was advanced back into the stomach. NG suction resumed. Had BM this AM.  Objective: Vital signs in last 24 hours: Temp:  [97.3 F (36.3 C)-98.4 F (36.9 C)] 97.8 F (36.6 C) (06/13 0600) Pulse Rate:  [53-58] 58 (06/13 0600) Resp:  [15-16] 16 (06/13 0600) BP: (132-140)/(66-78) 132/66 mmHg (06/13 0600) SpO2:  [92 %-95 %] 92 % (06/13 0600) Last BM Date: 12-05-2014  Intake/Output from previous day: 12-05-22 0701 - 06/13 0700 In: 1825 [P.O.:475; NG/GT:100] Out: 2100 [Urine:1475; Emesis/NG output:625] Intake/Output this shift:    PE: General--alert oriented, NG draining clear gastric juice  Abdomen--slight distended minimal tender few BSs  Lab Results:  Recent Labs  12-05-14 0330  WBC 21.0*  HGB 10.3*  HCT 30.8*  PLT 446*   BMET  Recent Labs  11/16/14 0417  NA 143  K 4.3  CL 109  CO2 25  CREATININE 0.75   LFT No results for input(s): PROT, AST, ALT, ALKPHOS, BILITOT, BILIDIR, IBILI in the last 72 hours. PT/INR No results for input(s): LABPROT, INR in the last 72 hours. PANCREAS No results for input(s): LIPASE in the last 72 hours.       Studies/Results: Dg Abd 2 Views  December 05, 2014   CLINICAL DATA:  Right lower quadrant abdominal pain. Small-bowel obstruction. Crohn's disease.  EXAM: ABDOMEN - 2 VIEW  COMPARISON:  CT abdomen and pelvis 11/13/2014  FINDINGS: An enteric tube has been placed with tip projecting over the distal thoracic esophagus. No intraperitoneal free air is identified. There is moderate dilatation of multiple small bowel loops in the central and left abdomen measuring up to approximately 4.5 cm in diameter and overall stable to slightly more prominent than on the prior study. A small amount of gas is again seen in the colon.  Opacities in the lung bases likely represent small pleural effusions and subsegmental atelectasis.  IMPRESSION: 1. Enteric tube terminates in the  distal thoracic esophagus. Recommend advancing by approximately 10 cm (15 cm if side hole placement in the stomach is desired). 2. Persistent small bowel dilatation consistent with partial obstruction.   Electronically Signed   By: Logan Bores   On: 12/05/2014 16:24    Medications: I have reviewed the patient's current medications.  Assessment/Plan: 1. Crohn's with partial SBO. Back on NG suction, will continue the suction for now, continue the IV steroids and consider reclamping the NG in the AM.   Andree Golphin JR,Dalisa Forrer L 11/18/2014, 7:48 AM  Pager: [phone] If no answer or after hours call [phone]

## 2014-11-18 NOTE — Progress Notes (Signed)
Subjective: Patient had to be restarted on NG suction due to nausea and abdominal cramping yesterday. Feels better this morning. Nausea and abdominal pain improved. Had a BM last night.   Objective: Vital signs in last 24 hours: Filed Vitals:   11/17/14 0517 11/17/14 1342 11/17/14 2201 11/18/14 0600  BP: 133/64 140/78 134/72 132/66  Pulse: 56 55 53 58  Temp: 97.5 F (36.4 C) 97.3 F (36.3 C) 98.4 F (36.9 C) 97.8 F (36.6 C)  TempSrc: Oral Axillary Axillary   Resp: 18 16 15 16   Height:      Weight:      SpO2: 96% 94% 95% 92%    Intake/Output Summary (Last 24 hours) at 11/18/14 1154 Last data filed at 11/18/14 0840  Gross per 24 hour  Intake   1845 ml  Output   1900 ml  Net    -55 ml   General: VS reviewed, lying in bed, in no acute distress, non diaphoretic, NG tube in place, draining dark bloody fluid  Cardiac: RRR, no rubs, murmurs or gallops Pulm: clear to auscultation bilaterally, no crackles, wheezes, or rhonchi, moving normal volumes of air Abd: mildly distended, tympanic, + BS, nontender, skin discoloration below umbilicus consistent with previous exam Ext: warm and well perfused, +1 pedal edema to mid shin, 2+ pulses   Lab Results: Basic Metabolic Panel: CBC:  Recent Labs  11/17/14 0330  WBC 21.0*  NEUTROABS 19.2*  HGB 10.3*  HCT 30.8*  MCV 78.0  PLT 446*   Medications: I have reviewed the patient's current medications. Scheduled Meds: . enoxaparin (LOVENOX) injection  40 mg Subcutaneous Q24H  .  HYDROmorphone (DILAUDID) injection  2 mg Intravenous 6 times per day  . methylPREDNISolone (SOLU-MEDROL) injection  80 mg Intravenous Q12H   Continuous Infusions: . dextrose 5 % and 0.45% NaCl     PRN Meds:.acetaminophen, ondansetron (ZOFRAN) IV, sodium chloride Assessment/Plan: Principal Problem:   Partial small bowel obstruction Active Problems:   Crohn's colitis   Reactive thrombocytosis   Long-term current use of methadone for opiate  dependence   Krystal [name] is a 54 yo F with a h/o diagnosed Crohn's disease, not on any therapy, who presents with abdominal pain, nausea, and anorexia. CT abdomen showed findings consistent with active Crohn's disease in the distal small bowel and a partial small bowel obstruction, due to either active inflammation vs stricture from chronic uncontrolled Crohn's. Passing flatus, abdominal distention much improved.   Chron's colitis Pt with Crohn's diagnosis since 2011, not on any therapy at home. CT abdomen consistent with active Chron's disease throughout distal small bowel, including the terminal ileum and cecum. CRP elevated at 14.4. Leukocytosis on CBC likely due to demargination s/p IV steroids. Thrombocytopenia on CBC is most likely reactive. Had to restart NGT on suction after nausea, abdominal pain yesterday.NG tube with 625 cc output overnight. - GI consulted, appreciate recs - Will try clamping NG tube again today, monitor closely for clinical progression - IV Solumedrol 80 mg q12h - d/c'ed IV Ciprofloxacin, Flagyl  -  Zofran for nausea prn  - Tylenol prn for pain  - f/u on GI pathogen panel to r/o coinfection as reason for flare   Partial SBO Visualized on CT. Patient is passing flatus. Her abdominal distention is much improved. No BS. Several BM over the past 2 days. NG output O/N at 625 ml. No evidence of peritoneal signs or concern for perforation at this point.  - surgery consulted: will continue to monitor, conservative medical management  for now  - Will try reclamping NG tube today - NPO  Current use of methadone for opiate dependence Patient is on 75 methadone for h/o opioid dependence. Follows with [facility]. Has been on current regimen for 2 years. Methadone might be playing a role in her h/o chronic constipation.  - d/ced methadone -started on dilaudid 2mg  q4hr   SOB Improving. Patient with subjective dyspnea since admission. Currently on 2L by [place],  with saturations in the low 90s. Denies chest pain, leg pain or swelling. VSS. Most likely due to atelectasis in setting of immobilization, pain.  - On RA - IS -OOB to chair -ambulate   DVT Ppx -lovenox 40 subq  FENGI: -NPO -D5 1/2NS @ 75 - Replete electrolytes as needed.   This is a Careers information officer Note.  The care of the patient was discussed with Dr. Gordy Levan and the assessment and plan formulated with their assistance.  Please see their attached note for official documentation of the daily encounter.   LOS: 5 days   [name], Med Student 11/18/2014, 11:54 AM

## 2014-11-19 DIAGNOSIS — K429 Umbilical hernia without obstruction or gangrene: Secondary | ICD-10-CM

## 2014-11-19 NOTE — Progress Notes (Signed)
Subjective:   Day of hospitalization: 6  VSS.  Unfortunately, NGT output was not recorded yesterday and was clamped all day instead of clamping only for ambulation.  She feels better without N/V.  She is concerned that she is having withdrawal symptoms from opioids and is counting down the hours until the next dose.  We discussed that increasing her dosing would only cause her a setback and she voices understanding.    Objective:   Vital signs in last 24 hours: Filed Vitals:   11/18/14 1320 11/18/14 2150 11/19/14 0104 11/19/14 0514  BP: 123/72 136/70 125/73 120/71  Pulse: 59 55 49 54  Temp: 97.9 F (36.6 C) 97.7 F (36.5 C) 97.1 F (36.2 C) 96.7 F (35.9 C)  TempSrc: Axillary Axillary Axillary Axillary  Resp: 16 16 16 16   Height:      Weight:      SpO2: 96% 97% 95% 96%    Weight: Filed Weights   11/15/14 1508 11/16/14 0542  Weight: 49.624 kg (109 lb 6.4 oz) 49.88 kg (109 lb 15.5 oz)    I/Os:  Intake/Output Summary (Last 24 hours) at 11/19/14 1329 Last data filed at 11/19/14 0500  Gross per 24 hour  Intake   1605 ml  Output    500 ml  Net   1105 ml    Physical Exam: Constitutional: Vital signs reviewed.  Patient is lying in bed in no acute distress and cooperative with exam.   HEENT: [place]/AT; EOMI, conjunctivae normal, no scleral icterus ; NGT in place.  Cardiovascular: RRR, no MRG Pulmonary/Chest: normal respiratory effort, no accessory muscle use, CTAB, no wheezes, rales, or rhonchi Abdominal: Distention greatly improved, +BS,  still with mild LLQ tenderness, no rebound or guarding; redness over the lower abdomen; mild umbilical hernia Neurological: A&O x3, CN II-XII grossly intact; non-focal exam Extremities: 2+DP b/l, no C/C/E  Skin: Warm, dry and intact.   Lab Results:  BMP:  Recent Labs Lab 11/15/14 0443 11/16/14 0417  NA 145 143  K 4.1 4.3  CL 112* 109  CO2 22 25  GLUCOSE 107* 144*  BUN 17 23*  CREATININE 0.80 0.75  CALCIUM 8.6* 8.8*     CBC:  Recent Labs Lab 11/13/14 0258  11/14/14 0322 11/17/14 0330  WBC 9.9  < > 18.3* 21.0*  NEUTROABS 8.2*  --   --  19.2*  HGB 11.5*  < > 10.1* 10.3*  HCT 34.5*  < > 30.0* 30.8*  MCV 79.7  < > 78.7 78.0  PLT 590*  < > 422* 446*  < > = values in this interval not displayed. LFTs:  Recent Labs Lab 11/13/14 0258 11/14/14 0322  AST 20 20  ALT 17 19  ALKPHOS 124 73  BILITOT 0.5 0.4  PROT 6.5 5.4*  ALBUMIN 2.7* 1.9*    Pancreatic Enzymes: No results for input(s): LIPASE, AMYLASE in the last 168 hours.  Lactic Acid/Procalcitonin:  Recent Labs Lab 11/13/14 0308 11/13/14 0520  LATICACIDVEN 1.61 1.61   Cardiac Enzymes: No results for input(s): CKTOTAL, CKMB, CKMBINDEX, TROPONINI in the last 168 hours.  EKG: EKG Interpretation  Date/Time:    Ventricular Rate:    PR Interval:    QRS Duration:   QT Interval:    QTC Calculation:   R Axis:     Text Interpretation:    Urinalysis:  Recent Labs Lab 11/13/14 0830  COLORURINE YELLOW  LABSPEC <1.005*  PHURINE 5.5  [place]  NEGATIVE  PROTEINUR NEGATIVE  UROBILINOGEN 0.2  NITRITE NEGATIVE  LEUKOCYTESUR NEGATIVE    Micro Results: No results found for this or any previous visit (from the past 240 hour(s)).  Blood Culture: No results found for: SDES, SPECREQUEST, CULT, REPTSTATUS  Studies/Results: Dg Abd 2 Views  11/19/14   CLINICAL DATA:  Right lower quadrant abdominal pain. Small-bowel obstruction. Crohn's disease.  EXAM: ABDOMEN - 2 VIEW  COMPARISON:  CT abdomen and pelvis 11/13/2014  FINDINGS: An enteric tube has been placed with tip projecting over the distal thoracic esophagus. No intraperitoneal free air is identified. There is moderate dilatation of multiple small bowel loops in the central and left abdomen measuring up to approximately 4.5 cm in diameter and overall stable to slightly more prominent than on the prior study. A small amount  of gas is again seen in the colon.  Opacities in the lung bases likely represent small pleural effusions and subsegmental atelectasis.  IMPRESSION: 1. Enteric tube terminates in the distal thoracic esophagus. Recommend advancing by approximately 10 cm (15 cm if side hole placement in the stomach is desired). 2. Persistent small bowel dilatation consistent with partial obstruction.   Electronically Signed   By: Logan Bores   On: 11-19-14 16:24    Medications:  Scheduled Meds: . enoxaparin (LOVENOX) injection  40 mg Subcutaneous Q24H  .  HYDROmorphone (DILAUDID) injection  2 mg Intravenous 6 times per day  . methylPREDNISolone (SOLU-MEDROL) injection  80 mg Intravenous Q12H   Continuous Infusions:   PRN Meds: acetaminophen, ondansetron (ZOFRAN) IV, sodium chloride  Antibiotics: Antibiotics Given (last 72 hours)    None      Day of Hospitalization: 6  Consults: Treatment Team:  Ronald Lobo, MD Wonda Horner, MD  Assessment/Plan:   Principal Problem:   Partial small bowel obstruction Active Problems:   Crohn's colitis   Reactive thrombocytosis   Long-term current use of methadone for opiate dependence  Partial SBO Pt continues to slowly mprove.  Active BS with distention improved.  She was clamped all day yesterday but was only supposed to be clamped intermittently.   -cont to monitor  -surgery and GI consulted, recommended conservative mgmt for now  -clamp NGT  -cont scheduled dilaudid   -cont IV solumedrol, but will need to start weaning down   Crohn's colitis Pt has presumed Crohn's and didn't follow up Dr. Cristina Gong.  CT Abd shows finding c/w active crohn's and partial SBO.  CRP elevated.  Pt cont on solumedrol.  Leukocytosis likely d/t steroids.    -clamp NGT  -zofran for nausea -appreciate GI, surgery recs  -cont IV solumedrol bid -d/c IV abx   Chronic constipation  Pt takes miralax daily to help with constipation likely induced by chronic methadone, but  has not taken it here.  Had several BM in recent days.      -cont to monitor    Dyspnea  Improved, is not required O2 currently.  Likely d/t atx but also the NGT may be contributing.  She is afebrile without tachycardia or LE swelling.  Doubt PNA given afebrile and PE unlikely as pt has been on anticoagulation without tachycardia.  O2  PRN.  She has been using IS and ambulating.   -IS -mobilize to chair and ambulate   Long term use of methadone for opioid addiction Has been on methadone 75mg  daily for 2 years prescribed by [facility].  She is concerned about withdrawing stating she had some symptoms such as diaphoresis and anxiety.  I think much of this is related to her concern about getting the scheduled dilaudid which I have written strict orders for her to be given this scheduled and not PRN.  We discussed that increasing her dosing would only cause a setback and make her problem worse.  We encouraged her to take a walk when it is close to her getting another dose.  She agreed to try this.   -cont dilaudid 2mg  q4h as methadone has a much longer half life   -will resume methadone tomorrow at a lower dose per her methadone clinic's recommendations   Tthrombocytosis Likely reactive d/t ongoing inflammation/infection.   -cont to monitor   F/E/N Fluids- d/c IVF  Electrolytes- Replete as needed  Nutrition- Clears   VTE PPx  -cont lovenox   Disposition Disposition is deferred, awaiting improvement of current medical problems.  Anticipated discharge in approximately 1-2 day(s).     LOS: 6 days   Jones Bales, MD PGY-2, Internal Medicine Teaching Service 11/19/2014, 1:29 PM

## 2014-11-19 NOTE — Progress Notes (Signed)
Subjective: Patient NGT remained unclamped yesterday and overnight. This morning she reports she is feeling well, without nausea or abdominal pain. She reports one stool overnight. Reports that she is having muscle aches and profuse sweating about 3 hours after her dilaudid is administered.   Objective: Vital signs in last 24 hours: Filed Vitals:   11/18/14 1320 11/18/14 2150 11/19/14 0104 11/19/14 0514  BP: 123/72 136/70 125/73 120/71  Pulse: 59 55 49 54  Temp: 97.9 F (36.6 C) 97.7 F (36.5 C) 97.1 F (36.2 C) 96.7 F (35.9 C)  TempSrc: Axillary Axillary Axillary Axillary  Resp: 16 16 16 16   Height:      Weight:      SpO2: 96% 97% 95% 96%    Intake/Output Summary (Last 24 hours) at 11/19/14 1156 Last data filed at 11/19/14 0500  Gross per 24 hour  Intake   1605 ml  Output    500 ml  Net   1105 ml   General: lying in bed, in no acute distress, non diaphoretic, NG tube in place, draining dark bloody fluid  Cardiac: RRR, no rubs, murmurs or gallops Pulm: clear to auscultation bilaterally, no crackles, wheezes, or rhonchi, moving normal volumes of air Abd: mildly distended, + BS, mildly tender to palpation in LLQ, skin discoloration below umbilicus consistent with previous exam Ext: warm and well perfused, no pedal edema, 2+ pulses    Medications: I have reviewed the patient's current medications. Scheduled Meds: . enoxaparin (LOVENOX) injection  40 mg Subcutaneous Q24H  .  HYDROmorphone (DILAUDID) injection  2 mg Intravenous 6 times per day  . methylPREDNISolone (SOLU-MEDROL) injection  80 mg Intravenous Q12H   Continuous Infusions:  PRN Meds:.acetaminophen, ondansetron (ZOFRAN) IV, sodium chloride Assessment/Plan: Principal Problem:   Partial small bowel obstruction Active Problems:   Crohn's colitis   Reactive thrombocytosis   Long-term current use of methadone for opiate dependence   Krystal [name] is a 54 yo F with a h/o diagnosed Crohn's disease, not on any  therapy at home, who presents with abdominal pain, nausea, and anorexia. CT abdomen showed findings consistent with active Crohn's disease in the distal small bowel and a partial small bowel obstruction, due to either active inflammation vs stricture from chronic uncontrolled Crohn's. Passing flatus, having BM, abdominal distention much improved.   Chron's colitis Pt with Crohn's diagnosis since 2011, not on any therapy at home. CT abdomen consistent with active Crohn's disease throughout distal small bowel, including the terminal ileum and cecum. CRP elevated at 14.4. Leukocytosis on CBC likely due to demargination s/p IV steroids. Thrombocytopenia on CBC is most likely reactive. NGT remained clamped ON. Patient did well ON.  - GI consulted, appreciate recs - Clamp NGT, ice chips for am, try to advance to clears later today - IV Solumedrol 80 mg q12h - d/c'ed IV Ciprofloxacin, Flagyl  - Zofran for nausea prn  - Tylenol prn for pain  - f/u on GI pathogen panel to r/o coinfection as reason for flare   Partial SBO Visualized on CT. Patient is passing flatus. Her abdominal distention is much improved. + BS. BM last night. NGT clamped last night, patient did well. No evidence of peritoneal signs or concern for perforation at this point.  - surgery consulted: will continue to monitor, conservative medical management for now  - Clamp tube today - ice chips for now, then trial of clears   Current use of methadone for opiate dependence Patient is on 75 methadone for h/o opioid dependence. Follows  with [facility]. Has been on current regimen for 2 years. Methadone might be playing a role in her h/o chronic constipation.  - Patient reports muscle aches, diaphoresis 3 hours after scheduled dilaudid dose, consistent with withdrawal symptoms. We talked about coping techniques to deal with symptoms vs advancing her dilaudid to 1.5mg  q3h. Will keep on current regimen for now, but if  symptoms worsen will consider dosing more frequently - d/ced methadone - on dilaudid 2mg  q4hr  - plan to restart methadone at lower dose (20-30mg ) once tolerating clear diet   SOB Resolved. Patient with subjective dyspnea since admission. Currently on 2L by [place], with saturations in the low 90s. Denies chest pain, leg pain or swelling. VSS. Most likely due to atelectasis in setting of immobilization, pain.  - On RA - IS -OOB to chair -ambulate   DVT Ppx -lovenox 40 subq  FENGI: -ice chips, then clears later today -D5 1/2NS @ 75, d/c when taking po - Replete electrolytes as needed.   This is a Careers information officer Note.  The care of the patient was discussed with Dr. Gordy Levan and the assessment and plan formulated with their assistance.  Please see their attached note for official documentation of the daily encounter.   LOS: 6 days   [name], Med Student 11/19/2014, 11:56 AM

## 2014-11-19 NOTE — Discharge Summary (Signed)
Name: Krystal Krystal [name] MRN: [id number] DOB: [birth year] 54 y.o. PCP: No Pcp Per Patient ______________________________________________________________  Date of Admission: 11/13/2014  2:09 AM Date of Discharge: 11/23/2014 Attending Physician: Annia Belt, MD   Discharge Diagnosis: Partial SBO Crohn's colitis Chronic constipation Krystal [name] term use of methadone for opioid addiction  Thrombocytosis  Discharge Medications:   Medication List    TAKE these medications        feeding supplement (RESOURCE BREEZE) Liqd  Take 1 Container by mouth 3 (three) times daily between meals.     methadone 10 MG/ML solution  Commonly known as:  DOLOPHINE  Take 4 mLs (40 mg total) by mouth daily.     polyethylene glycol packet  Commonly known as:  MIRALAX / GLYCOLAX  Take 17 g by mouth daily.     predniSONE 20 MG tablet  Commonly known as:  DELTASONE  3 tabs po daily x 5 days, then 2 tabs x 5 days, then 1.5 tabs x 5 days, then 1 tab x 5 days, then 0.5 tabs x 5 days        Disposition and follow-up:   Ms.Krystal Krystal [name] was discharged from [facility] in good condition to home.    Please address the following problems post-discharge:  1.Resolution of Crohn's flare 2. Completion of prednisone taper 3. Follow up with GI and methadone clinic 4.Needs Krystal [name] term management plan for Crohn's which doesn't require chronic opioids  Labs / imaging needed at time of follow-up: None  Pending labs/ test needing follow-up: None  Follow-up Appointments: Follow-up Information    Schedule an appointment as soon as possible for a visit with [facility].   Why:  a hospital follow up appointment   Contact information:   [place] [address] [phone]       Schedule an appointment as soon as possible for a visit with [facility].   Specialty:  General Surgery   Why:  a hospital follow up appointment   Contact  information:   [place]  [address] [phone]       Discharge Instructions: Discharge Instructions    Call MD for:  persistant nausea and vomiting    Complete by:  As directed      Call MD for:  severe uncontrolled pain    Complete by:  As directed      Diet - low sodium heart healthy    Complete by:  As directed      Discharge instructions    Complete by:  As directed   Please continue to take it slowly with your diet.  You will also need to follow up with GI and surgery following hospitalization.  You will also need to find a primary care doctor.  We will put you on a slow prednisone taper-->please follow the instructions.     Increase activity slowly    Complete by:  As directed            Consultations: Treatment Team:  Ronald Lobo, MD Wonda Horner, MD  Procedures Performed:  Ct Abdomen Pelvis W Contrast  11/13/2014   CLINICAL DATA:  Severe generalized abdominal pain. Nausea. Crohn's disease.  EXAM: CT ABDOMEN AND PELVIS WITH CONTRAST  TECHNIQUE: Multidetector CT imaging of the abdomen and pelvis was performed using the standard protocol following bolus administration of intravenous contrast.  CONTRAST:  21mL OMNIPAQUE IOHEXOL 300 MG/ML  SOLN  COMPARISON:  08/13/2009  FINDINGS: Lower Chest:  Unremarkable.  Hepatobiliary: No masses identified. Mild periportal edema noted. Gallbladder is unremarkable.  Pancreas: No mass, inflammatory changes, or other significant abnormality identified.  Spleen:  Within normal limits in size and appearance.  Adrenals:  No masses identified.  Kidneys/Urinary Tract:  No evidence of masses or hydronephrosis.  Stomach/Bowel/Peritoneum: Mild to moderate dilatation of mid and distal small bowel loops is seen. There is moderate wall thickening and abnormal mucosal enhancement seen multiple loops of ileum within the right abdomen and pelvis, including the terminal ileum. Adjacent inflammatory changes are seen within the right  abdominal and pelvic mesentery and there is also mild wall thickening involving the cecum and ascending colon. This is consistent with active Crohn's disease, with associated partial small bowel obstruction.  Mild to moderate ascites is seen in the both upper quadrants, right paracolic gutter, and pelvis, however no focal abscesses visualized. Small hiatal hernia also noted.  Vascular/Lymphatic: Mild lymphadenopathy is seen in the central small bowel mesentery, likely reactive in etiology. No other sites of lymphadenopathy identified.  Reproductive: Prior hysterectomy noted. No adnexal masses identified  Other:  None.  Musculoskeletal:  No suspicious bone lesions identified.  IMPRESSION: Findings consistent with active Crohn's disease involving multiple distal small bowel loops including the terminal ileum, and the cecum. This is causing a partial small bowel obstruction.  Mild to moderate ascites mainly in the right abdomen and pelvis. No focal abscess or free air identified.  Mild lymphadenopathy in small bowel mesentery, likely reactive in etiology.   Electronically Signed   By: Earle Gell M.D.   On: 11/13/2014 07:20   Dg Abd 2 Views  11/17/2014   CLINICAL DATA:  Right lower quadrant abdominal pain. Small-bowel obstruction. Crohn's disease.  EXAM: ABDOMEN - 2 VIEW  COMPARISON:  CT abdomen and pelvis 11/13/2014  FINDINGS: An enteric tube has been placed with tip projecting over the distal thoracic esophagus. No intraperitoneal free air is identified. There is moderate dilatation of multiple small bowel loops in the central and left abdomen measuring up to approximately 4.5 cm in diameter and overall stable to slightly more prominent than on the prior study. A small amount of gas is again seen in the colon.  Opacities in the lung bases likely represent small pleural effusions and subsegmental atelectasis.  IMPRESSION: 1. Enteric tube terminates in the distal thoracic esophagus. Recommend advancing by  approximately 10 cm (15 cm if side hole placement in the stomach is desired). 2. Persistent small bowel dilatation consistent with partial obstruction.   Electronically Signed   By: Logan Bores   On: 11/17/2014 16:24    2D Echo: N/A  Cardiac Cath: N/A  Admission HPI:  Ms. Krystal Krystal [name] is a 66 yr woman pmh Crohns disease not on any treatment and managed by methadone 75mg  presents with ongoing RLQ pain, nausea, and anorexia x2wks. Patient states that she has not followed with with her GI doctor since her "presumed" diagnosis and was given phenergan suppositories only at her initial visit. Since that time she has had bouts of pain and some anorexia/nausea that usually resolve in 1-2 wks and she never sought treatment or returned for her follow-up. 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 She has only been tolerating baby food. She states that the intensity of the pain got worse overnight  along with the nausea, chills that caused her to present for evaluation. She denied any BRBPR, hematochezia, hematemesis, chest pain, inability to pass flatus, constipation, or skin rashes. Pt denied any recent illnesses. She is a non-smoker and doesn't use any other drugs.   Original H&P: Krystal Gallant, MD  Hospital Course by problem list: Principal Problem:   Partial small bowel obstruction Active Problems:   Crohn's colitis   Reactive thrombocytosis   Krystal [name]-term current use of methadone for opiate dependence   Malnutrition of moderate degree   Crohn's colitis with partial SBO Patient with a h/o diagnosed Crohn's disease, diagnosed several years ago by Dr. Cristina Gong, not on any therapy at home, who presented with worsening abdominal pain, nausea, and anorexia. She has a history of intermittent bouts of abdominal pain, distention, nausea and vomiting,  that she has been managing at home by transitioning to a soft diet and visits to urgent care centers. On presentation to the ED, she was found to have a distended, tender abdomen. Her vitals were as follows: blood pressure 128/68, pulse 99, resp. rate 30, SpO2 94 %. A CT abdomen showed findings consistent with active Crohn's disease throughout distal small bowel, including the terminal ileum and cecum and a partial small bowel obstruction, due to either active inflammation vs stricture from chronic uncontrolled Crohn's. CRP was elevated at 14.4. CBC revealed thrombocytopenia to 590, which was most likely reactive, and a WBC of 9.9. Surgery and GI were consulted, and the decision was made to manage her medically. She was started on IV Ciprofloxacin, IV Flagyl, and IV Solumedrol. She was made NPO and placed on NG suction. We treated her symptomatically for pain and nausea. Her IV antibiotics were continued for 5 days. Once her abdominal distention improved and she started passing flatus, we discontinued NG suctioning and slowly advanced her diet. Her IV steroids were transitioned to prednisone 60 mg PO. Upon discharge she was tolerating a soft diet, passing flatus, having BMs, and her abdominal distention had considerably improved. She was discharged on a prednisone taper, and was instructed to follow up with GI as an outpatient for further management of her Crohn's. She received nutritional education on maintaining a low residue, low fiber diet.   Current use of methadone for opiate dependence  Patient has been on 75 mg methadone for h/o opioid dependence. She follows with [facility]. She has been on current regimen for 2 years. We felt that her chronic methadone use was playing a role in her decreased bowel motility. The patient reported that she is on daily Miralax at home for chronic constipation. Given her SBO, and inability to take PO, we transitioned her methadone to hydromorphone IV 2mg   q4h. She was restarted on methadone 30 mg once she started tolerating a liquid diet. This was changed to methadone 40mg  due to patient's anxiety over reducing her methadone dose too drastically from her home 75mg . She tolerated the transition well and denied any withdrawal symptoms. She will follow up with the methadone clinic tomorrow to resume her treatment with them. They are aware that she was restarted on a lower dose at 40 mg and we discussed with the clinic that the patient will need to be weaned off of her methadone, given her medical condition. We also advised the patient that psychological counseling or CBT might be beneficial as an outpatient to deal with the anxiety around her GI sxs and methadone use. The patient was agreeable with the plan. She was given a  list of primary care and mental health providers on discharge for further outpatient follow up.   Thrombocytosis Likely reactive due to ongoing inflammation.  Will need monitoring as an outpatient.   Discharge Vitals:   BP 101/54 mmHg  Pulse 66  Temp(Src) 98.8 F (37.1 C) (Oral)  Resp 18  Ht 5\' 2"  (1.575 m)  Wt 49.88 kg (109 lb 15.5 oz)  BMI 20.11 kg/m2  SpO2 98%  Discharge Labs:  No results found for this or any previous visit (from the past 24 hour(s)).  Signed: Jones Bales, MD 11/23/2014, 12:01 PM   Services Ordered on Discharge: None  Equipment Ordered on Discharge: None

## 2014-11-19 NOTE — Progress Notes (Signed)
[facility] PROGRESS NOTE Subjective Pt had NG clamped yesterday and today and is feeling ok w/o significant bloating.  Objective: Vital signs in last 24 hours: Temp:  [96.7 F (35.9 C)-97.7 F (36.5 C)] 97.4 F (36.3 C) (06/14 1348) Pulse Rate:  [49-58] 58 (06/14 1348) Resp:  [16-18] 18 (06/14 1348) BP: (114-136)/(68-73) 114/68 mmHg (06/14 1348) SpO2:  [95 %-97 %] 96 % (06/14 1348) Last BM Date: 11/18/14  Intake/Output from previous day: 06/13 0701 - 06/14 0700 In: 1725 [P.O.:180; I.V.:1545] Out: 500 [Emesis/NG output:500] Intake/Output this shift:    PE: General--NG clamped no distress  Abdomen--minimal distention, few BSs little tenderness  Lab Results:  Recent Labs  Dec 09, 2014 0330  WBC 21.0*  HGB 10.3*  HCT 30.8*  PLT 446*   BMET No results for input(s): NA, K, CL, CO2, CREATININE in the last 72 hours. LFT No results for input(s): PROT, AST, ALT, ALKPHOS, BILITOT, BILIDIR, IBILI in the last 72 hours. PT/INR No results for input(s): LABPROT, INR in the last 72 hours. PANCREAS No results for input(s): LIPASE in the last 72 hours.       Studies/Results: Dg Abd 2 Views  December 09, 2014   CLINICAL DATA:  Right lower quadrant abdominal pain. Small-bowel obstruction. Crohn's disease.  EXAM: ABDOMEN - 2 VIEW  COMPARISON:  CT abdomen and pelvis 11/13/2014  FINDINGS: An enteric tube has been placed with tip projecting over the distal thoracic esophagus. No intraperitoneal free air is identified. There is moderate dilatation of multiple small bowel loops in the central and left abdomen measuring up to approximately 4.5 cm in diameter and overall stable to slightly more prominent than on the prior study. A small amount of gas is again seen in the colon.  Opacities in the lung bases likely represent small pleural effusions and subsegmental atelectasis.  IMPRESSION: 1. Enteric tube terminates in the distal thoracic esophagus. Recommend advancing by approximately 10 cm  (15 cm if side hole placement in the stomach is desired). 2. Persistent small bowel dilatation consistent with partial obstruction.   Electronically Signed   By: Logan Bores   On: 2014/12/09 16:24    Medications: I have reviewed the patient's current medications.  Assessment/Plan: 1. Partial SBO due to Crohn's disease of cecum and TI. Appears to be improving agree with tryin clears, told her to start with soda only first ? Remove NG tomorrow.   Zauria Dombek JR,Jhalil Silvera L 11/19/2014, 3:20 PM  Pager: [phone] If no answer or after hours call [phone]

## 2014-11-20 DIAGNOSIS — Z79891 Long term (current) use of opiate analgesic: Secondary | ICD-10-CM

## 2014-11-20 DIAGNOSIS — K50912 Crohn's disease, unspecified, with intestinal obstruction: Secondary | ICD-10-CM

## 2014-11-20 MED ORDER — METHYLPREDNISOLONE SODIUM SUCC 125 MG IJ SOLR
60.0000 mg | Freq: Two times a day (BID) | INTRAMUSCULAR | Status: AC
  Administered 2014-11-21: 60 mg via INTRAVENOUS
  Filled 2014-11-20: qty 2

## 2014-11-20 MED ORDER — PREDNISONE 50 MG PO TABS
60.0000 mg | ORAL_TABLET | Freq: Every day | ORAL | Status: DC
  Administered 2014-11-21 – 2014-11-23 (×3): 60 mg via ORAL
  Filled 2014-11-20 (×5): qty 1

## 2014-11-20 MED ORDER — BOOST / RESOURCE BREEZE PO LIQD
1.0000 | Freq: Three times a day (TID) | ORAL | Status: DC
  Administered 2014-11-20 – 2014-11-21 (×4): 1 via ORAL
  Administered 2014-11-22: 15:00:00 via ORAL
  Administered 2014-11-22 – 2014-11-23 (×3): 1 via ORAL

## 2014-11-20 NOTE — Progress Notes (Signed)
Initial Nutrition Assessment  DOCUMENTATION CODES:  Non-severe (moderate) malnutrition in context of chronic illness  INTERVENTION:  -Resource Breeze po TID, each supplement provides 250 kcal and 9 grams of protein -RD to follow for diet advancement -Consider initiation of TN if unable to advance diet above clear liquids   NUTRITION DIAGNOSIS:  Malnutrition related to chronic illness as evidenced by moderate depletions of muscle mass, moderate to severe fluid accumulation.  GOAL:  Patient will meet greater than or equal to 90% of their needs   MONITOR:  PO intake, Supplement acceptance, Diet advancement, Skin, Weight trends, Labs, I & O's  REASON FOR ASSESSMENT:  NPO/Clear Liquid Diet    ASSESSMENT: Krystal [name] is a 54 yo F with a h/o diagnosed Crohn's disease, not on any therapy at home, who presents with abdominal pain, nausea, and anorexia. CT abdomen showed findings consistent with active Crohn's disease in the distal small bowel and a partial small bowel obstruction, due to either active inflammation vs stricture from chronic uncontrolled Crohn's. Passing flatus, having BM, abdominal distention much improved  Hx obtained by pt and family at bedside. Pt reports longstanding hx of crohn's disease.   She reveals that she has always been a petite person, but recently started losing weight at her last flare-up (down to a weight of 80-85#),  but has slowly increased her weight. She reveals UBW of 100-102# and feels that her weight increase above UBW is likely due to abdominal distention, which was noticed on exam.   Pt reports she is extremely active person at baseline (operates a house cleaning business), but has since been less active in the past few months due to healing issues.   Pt with NGT, which is clamped. She reports she is taking minimal amounts of clear liquids and feels optimistic that they are staying down without nausea and vomiting. Spoke with RN who confirms  that pt is consuming small sips of liquids. RN reports that plan is to observe pt with clamping trial, with possible removal and diet advancement after NGT is removed. Noted 200 ml output from NGT within the past 24 hours per doc flowsheets.   Pt expresses frustration with lack of variety of clear liquid diet. Discussed principles and rationale for clear liquid diet and discussed potential for diet advancement. Pt is amenable to Resource Breeze supplement to maximize nutritional status. Pt and family expressed appreciation for visit.  Nutrition-Focused physical exam completed. Findings are mild to moderate fat depletion, mild to moderate muscle depletion, and no edema.   Height:  Ht Readings from Last 1 Encounters:  11/15/14 5\' 2"  (1.575 m)    Weight:  Wt Readings from Last 1 Encounters:  11/16/14 109 lb 15.5 oz (49.88 kg)    Ideal Body Weight:  50 kg  Wt Readings from Last 10 Encounters:  11/16/14 109 lb 15.5 oz (49.88 kg)    BMI:  Body mass index is 20.11 kg/(m^2).  Estimated Nutritional Needs:  Kcal:  1500-1700  Protein:  65-75 grams  Fluid:  1.5-1.7 L  Skin:  Reviewed, no issues  Diet Order:  Diet clear liquid Room service appropriate?: Yes; Fluid consistency:: Thin  EDUCATION NEEDS:  Education needs addressed   Intake/Output Summary (Last 24 hours) at 11/20/14 1319 Last data filed at 11/20/14 0907  Gross per 24 hour  Intake    240 ml  Output    200 ml  Net     40 ml    Last BM:  11/18/14  Krystal Quain A.  Jimmye Norman, RD, LDN, CDE Pager: [phone] After hours Pager: [phone]

## 2014-11-20 NOTE — Progress Notes (Signed)
[facility] PROGRESS NOTE Subjective patient still having some pain and discomfort, NG in place but is not required to be hooked up to suction. Having some stools and passing some air, taking some clear liquids. Objective: Vital signs in last 24 hours: Temp:  [97.4 F (36.3 C)-98.8 F (37.1 C)] 98.8 F (37.1 C) (06/15 0532) Pulse Rate:  [55-64] 64 (06/15 0532) Resp:  [17-18] 17 (06/15 0532) BP: (104-116)/(68-84) 116/68 mmHg (06/15 0532) SpO2:  [96 %-97 %] 97 % (06/15 0532) Last BM Date: 11/18/14  Intake/Output from previous day: 06/14 0701 - 06/15 0700 In: -  Out: 200 [Emesis/NG output:200] Intake/Output this shift: Total I/O In: 240 [P.O.:240] Out: -   PE: General-- patient somewhat anxious, NG in place but clamped  Abdomen-- slight distended some bowel sounds mild tenderness.  Lab Results: No results for input(s): WBC, HGB, HCT, PLT in the last 72 hours. BMET No results for input(s): NA, K, CL, CO2, CREATININE in the last 72 hours. LFT No results for input(s): PROT, AST, ALT, ALKPHOS, BILITOT, BILIDIR, IBILI in the last 72 hours. PT/INR No results for input(s): LABPROT, INR in the last 72 hours. PANCREAS No results for input(s): LIPASE in the last 72 hours.       Studies/Results: No results found.  Medications: I have reviewed the patient's current medications.  Assessment/Plan: 1. Crohn's flare with partial small bowel obstruction. Recovery of bowel function complicated by chronic narcotic use. Would consider switching to oral prednisone tomorrow and hope that she can be discharged on clear liquids with advancing slowly to full liquids and low residue diet. She can follow up his outpatient and 2 to 3 weeks with Dr. Cristina Gong.   Joory Gough JR,Annaka Cleaver L 11/20/2014, 1:44 PM  Pager: [phone] If no answer or after hours call [phone]

## 2014-11-20 NOTE — Progress Notes (Signed)
Patient voices complaint of gas pain. Abdomen tender to touch with increased distention from initial shift assessment. Complains of intermittent nausea. Declines to take nausea medication, zofran. States nausea is worse when pain medication is given. States when she stays still the nausea and pain decreases.

## 2014-11-20 NOTE — Progress Notes (Signed)
Subjective: Patient reports "gas pain" O/N. She describes it as intermittent sharp pain in the L side of her abdomen. She reports some nausea overnight. She says that her symptoms have improved this morning. Reports 2 BMs last night, denies any flatus. She had minimal PO intake yesterday as she is worried this might precipitate her pain, but did say that she had a couple of sips of apple juice yesterday.  Objective: Vital signs in last 24 hours: Filed Vitals:   11/19/14 0514 11/19/14 1348 11/19/14 2106 11/20/14 0532  BP: 120/71 114/68 104/84 116/68  Pulse: 54 58 55 64  Temp: 96.7 F (35.9 C) 97.4 F (36.3 C) 98.8 F (37.1 C) 98.8 F (37.1 C)  TempSrc: Axillary Axillary Axillary Oral  Resp: 16 18 18 17   Height:      Weight:      SpO2: 96% 96% 96% 97%   Weight change:   Intake/Output Summary (Last 24 hours) at 11/20/14 0858 Last data filed at 11/19/14 1500  Gross per 24 hour  Intake      0 ml  Output    200 ml  Net   -200 ml   General: sitting up in chair, in no acute distress, non diaphoretic, NG tube in place  Cardiac: RRR, no rubs, murmurs or gallops Pulm: clear to auscultation bilaterally, no crackles, wheezes, or rhonchi, moving normal volumes of air Abd: mildly distended, quiet BS, mildly tender to palpation esp in bilateral lower quadrants, skin discoloration below umbilicus consistent with previous exam Ext: warm and well perfused, no pedal edema, 2+ pulses   Medications: I have reviewed the patient's current medications. Scheduled Meds: . enoxaparin (LOVENOX) injection  40 mg Subcutaneous Q24H  .  HYDROmorphone (DILAUDID) injection  2 mg Intravenous 6 times per day  . methylPREDNISolone (SOLU-MEDROL) injection  80 mg Intravenous Q12H   Continuous Infusions:  PRN Meds:.acetaminophen, ondansetron (ZOFRAN) IV, sodium chloride Assessment/Plan: Principal Problem:   Partial small bowel obstruction Active Problems:   Crohn's colitis   Reactive thrombocytosis  Long-term current use of methadone for opiate dependence   Ms. Spainhower is a 54 yo F with a h/o diagnosed Crohn's disease, not on any therapy at home, who presents with abdominal pain, nausea, and anorexia. CT abdomen showed findings consistent with active Crohn's disease in the distal small bowel and a partial small bowel obstruction, due to either active inflammation vs stricture from chronic uncontrolled Crohn's.    Chron's colitis Pt with Crohn's diagnosis since 2011, not on any therapy at home. CT abdomen consistent with active Crohn's disease throughout distal small bowel, including the terminal ileum and cecum. CRP elevated at 14.4. Leukocytosis on CBC likely due to demargination s/p IV steroids. Thrombocytopenia on CBC is most likely reactive. NGT remained clamped ON. Patient reports some "gas pain" and nausea O/N that improved this morning. Abdominal exam today with quiet BS.     - GI consulted, appreciate recs - Clamp NGT, keep NG tube in for today - Clear diet, if tolerates well today, transition to PO prednisone and PO methadone tomorrow  - cnt IV Solumedrol 80 mg q12h - d/c'ed IV Ciprofloxacin, Flagyl  - Zofran for nausea prn  - Tylenol prn for pain  - f/u on GI pathogen panel to r/o coinfection as reason for flare   Partial SBO Visualized on CT. Patient is passing flatus. Her abdominal distention is much improved since admission. Reports BM x2 last night but denies flatus. NGT clamped last night, patient reported "gas pain" and  nausea O/N that have since improved. No evidence of peritoneal signs or concern for perforation at this point.Abdominal exam today with quiet BS.     - surgery consulted: will continue to monitor, conservative medical management for now  - Clamp tube, keep NGT in for today, reassess for clinical progression tomorrow  - cnt with clear diet today  - if patient clinically worsens today, will re-consult surgery re the possible need of surgical management     Current use of methadone for opiate dependence Patient is on 75 methadone for h/o opioid dependence. Follows with [facility]. Has been on current regimen for 2 years. Methadone might be playing a role in her h/o chronic constipation.  - Patient reports muscle aches, diaphoresis 3 hours after scheduled dilaudid dose, consistent with withdrawal symptoms. We talked about coping techniques to deal with symptoms vs advancing her dilaudid to 1.5mg  q3h. Will keep on current regimen for now, but if symptoms worsen will consider dosing more frequently - d/ced methadone - cnt dilaudid 2mg  q4hr  - plan to restart methadone at lower dose (20-30mg ) once tolerating clear diet   SOB Resolved. Patient with subjective dyspnea since admission. Currently on 2L by , with saturations in the low 90s. Denies chest pain, leg pain or swelling. VSS. Most likely due to atelectasis in setting of immobilization, pain.  - On RA - IS -OOB to chair -ambulate   DVT Ppx -lovenox 40 subq  FENGI: -Clear diet  -dc'ed IV fluids - Replete electrolytes as needed.   This is a Careers information officer Note.  The care of the patient was discussed with Dr. Eppie Gibson and the assessment and plan formulated with their assistance.  Please see their attached note for official documentation of the daily encounter.   LOS: 7 days   [name], Med Student 11/20/2014, 8:58 AM

## 2014-11-20 NOTE — Progress Notes (Signed)
Internal Medicine Attending Progress Note Date: 11/20/2014  Patient name: Krystal [name] Medical record number: [id number] Date of birth: 1961/01/13 Age: 54 y.o. Gender: female  S: She noted some left-sided abdominal pain last night and this morning. She attributed this to gas. She states she's had 2 bowel movements but when asked specifically, it was more a small amount of water. She has passed no flatus. The NG tube had been clamped all of yesterday and late into the evening and into this morning she noted some nausea which she says is improved. She admits to being somewhat nervous about eating clear liquids so as not to bring back the pain. She is without other acute complaints at this time.  Physical Exam:  Filed Vitals:   11/19/14 0514 11/19/14 1348 11/19/14 2106 11/20/14 0532  BP: 120/71 114/68 104/84 116/68  Pulse: 54 58 55 64  Temp: 96.7 F (35.9 C) 97.4 F (36.3 C) 98.8 F (37.1 C) 98.8 F (37.1 C)  TempSrc: Axillary Axillary Axillary Oral  Resp: 16 18 18 17   Height:      Weight:      SpO2: 96% 96% 96% 97%   Gen: WDWN woman sitting anxiously in bed in NAD.  NGT in place and clamped. Abd: Mildly distended compared to yesterday's exam. Increased tenderness in the left lower quadrant and left upper quadrant compared to yesterday's exam although the abdomen remains soft without rebound. Her bowel sounds are very quiet and reduced from yesterday's exam. Extremities: Without edema  Lab results:  None  Assessment/Plan  1) Crohn's flare: She has been on IV steroids for 7 days with only minimal symptomatic relief, most coming after NG tube suctioning. I am concerned she has not passed any flatus and the small amount of stool that she has passed sounds more like water than true stool. At this point, I would have expected improved colon output. I am also concerned about the nausea that she experienced last night and earlier this morning which suggests that clamping the NG tube may  have revealed that her obstruction is not significantly improved despite the 7 days of IV steroids. I also feel she may be minimizing her nausea as she is very motivated to be discharged home to make her daughter's wedding. We had a long discussion about the importance of being honest if she were to develop the nausea and vomiting with the NG tube clamped. We also encouraged her to try more clear liquids to see if in fact her oral intake is sufficient to keep herself hydrated without intravenous fluids. We put out the idea that if she did develop nausea we would have to take a step back and reassess what may be the best approach to manage her Crohn's and partial small bowel obstruction at this point.  2) Partial small bowel obstruction: Of the terminal ileum likely related to chronic untreated Crohn's disease. I am concerned, after 7 days of IV steroids, that we have not seen appreciable improvement in her partial small bowel obstruction. I suspect as she increases her intake of clear liquids her nausea may worsen or she may admit to more nausea tomorrow. If this is the case will will reconsult surgery to reassess for possible resection of the likely stenotic terminal ileum. If she does improve and is able to keep herself orally hydrated she would then be a candidate for the initiation of oral medications tomorrow to further assess her tolerance using the GI tract.  3) Disposition: We will reassess  her symptoms after pushing clear liquids and keeping the NG tube clamped today. At a minimum, I expect she'll still require 2-3 days of inpatient care assuming she does not require surgical intervention.

## 2014-11-21 DIAGNOSIS — E440 Moderate protein-calorie malnutrition: Secondary | ICD-10-CM | POA: Insufficient documentation

## 2014-11-21 LAB — BASIC METABOLIC PANEL
ANION GAP: 9 (ref 5–15)
BUN: 14 mg/dL (ref 6–20)
CHLORIDE: 97 mmol/L — AB (ref 101–111)
CO2: 29 mmol/L (ref 22–32)
CREATININE: 0.73 mg/dL (ref 0.44–1.00)
Calcium: 7.9 mg/dL — ABNORMAL LOW (ref 8.9–10.3)
GFR calc non Af Amer: >60 mL/min (ref >60)
Glucose, Bld: 129 mg/dL — ABNORMAL HIGH (ref 65–99)
POTASSIUM: 4.1 mmol/L (ref 3.5–5.1)
Sodium: 135 mmol/L (ref 135–145)

## 2014-11-21 LAB — MAGNESIUM: Magnesium: 2 mg/dL (ref 1.7–2.4)

## 2014-11-21 MED ORDER — METHADONE HCL 10 MG PO TABS
30.0000 mg | ORAL_TABLET | Freq: Every day | ORAL | Status: DC
  Administered 2014-11-21 – 2014-11-22 (×2): 30 mg via ORAL
  Filled 2014-11-21 (×2): qty 3

## 2014-11-21 MED ORDER — HYDROMORPHONE HCL 1 MG/ML IJ SOLN: 1.0000 mg | INTRAMUSCULAR | Status: DC | PRN

## 2014-11-21 MED ORDER — METHADONE 0.4 MG/ML ORAL SOLUTION: 30.0000 mg | Freq: Every day | ORAL | Status: DC

## 2014-11-21 NOTE — Progress Notes (Signed)
Subjective: Patient is feeling well today. Is tolerating clear diet well. Denies n/v, abdominal pain. Is passing flatus and had a bm last night.   Objective: Vital signs in last 24 hours: Filed Vitals:   11/20/14 0532 11/20/14 1300 11/20/14 2055 11/21/14 0547  BP: 116/68 110/63 118/73 110/62  Pulse: 64 64 61 69  Temp: 98.8 F (37.1 C) 97.7 F (36.5 C) 97.9 F (36.6 C) 98.1 F (36.7 C)  TempSrc: Oral Axillary Axillary Axillary  Resp: 17 17 16 16   Height:      Weight:      SpO2: 97% 97% 96% 98%   Weight change:   Intake/Output Summary (Last 24 hours) at 11/21/14 0844 Last data filed at 11/20/14 1849  Gross per 24 hour  Intake   1200 ml  Output      0 ml  Net   1200 ml   General: sitting up in chair, in no acute distress, non diaphoretic, NG tube in place, not on suction Cardiac: RRR, no rubs, murmurs or gallops Pulm: clear to auscultation bilaterally, no crackles, wheezes, or rhonchi, moving normal volumes of air Abd: mildly distended, + BS in all four quadrants, mildly tender to palpation esp in bilateral lower quadrants, skin discoloration below umbilicus consistent with previous exam Ext: warm and well perfused, no pedal edema, 2+ pulses   Lab Results: Basic Metabolic Panel:  Recent Labs  11/21/14 0536  NA 135  K 4.1  CL 97*  CO2 29  GLUCOSE 129*  BUN 14  CREATININE 0.73  CALCIUM 7.9*  MG 2.0   Medications: I have reviewed the patient's current medications. Scheduled Meds: . enoxaparin (LOVENOX) injection  40 mg Subcutaneous Q24H  . feeding supplement (RESOURCE BREEZE)  1 Container Oral TID BM  .  HYDROmorphone (DILAUDID) injection  2 mg Intravenous 6 times per day  . predniSONE  60 mg Oral Q breakfast   Continuous Infusions:  PRN Meds:.acetaminophen, sodium chloride Assessment/Plan: Principal Problem:   Partial small bowel obstruction Active Problems:   Crohn's colitis   Reactive thrombocytosis   Long-term current use of methadone for opiate  dependence   Krystal [name] is a 54 yo F with a h/o diagnosed Crohn's disease, not on any therapy at home, who presents with abdominal pain, nausea, and anorexia. CT abdomen showed findings consistent with active Crohn's disease in the distal small bowel and a partial small bowel obstruction, due to either active inflammation vs stricture from chronic uncontrolled Crohn's.   Chron's colitis Pt with Crohn's diagnosis since 2011, not on any therapy at home. CT abdomen consistent with active Crohn's disease throughout distal small bowel, including the terminal ileum and cecum. CRP elevated at 14.4. Leukocytosis on CBC likely due to demargination s/p IV steroids. Thrombocytopenia on CBC is most likely reactive. NGT remained clamped ON. Patient is tolerating liquid diet well. Is passing flatus, one small BM ON. Denies n/v.  - GI consulted, appreciate recs - Remove NG tube today - Full liquid diet - convert IV Solumedrol mg q12h to po prednisone  - d/c'ed IV Ciprofloxacin, Flagyl  - Zofran for nausea prn  - Tylenol prn for pain   Partial SBO Visualized on CT. Patient is passing flatus. Her abdominal distention is much improved since admission. Reports BM x2 last night but denies flatus. NGT clamped last night, patient reported "gas pain" and nausea O/N that have since improved. No evidence of peritoneal signs or concern for perforation at this point.Abdominal exam today with quiet BS.  -  surgery consulted: will continue to monitor, conservative medical management for now  - remove NG tube   - full liquid diet today - if patient clinically worsens today, will re-consult surgery re the possible need of surgical management   Current use of methadone for opiate dependence Patient is on 75 methadone for h/o opioid dependence. Follows with [facility]. Has been on current regimen for 2 years. Methadone might be playing a role in her h/o chronic constipation.  - will restart  methadone po at 30 mg - dilaudid 1mg  q4hr for breakthrough   SOB Resolved. Patient with subjective dyspnea since admission. Currently on 2L by [place], with saturations in the low 90s. Denies chest pain, leg pain or swelling. VSS. Most likely due to atelectasis in setting of immobilization, pain.  - On RA - IS -OOB to chair -ambulate   DVT Ppx -lovenox 40 subq  FENGI: -Full liquid diet  -dc'ed IV fluids - Replete electrolytes as needed.   This is a Careers information officer Note.  The care of the patient was discussed with Dr. Gordy Levan and the assessment and plan formulated with their assistance.  Please see their attached note for official documentation of the daily encounter.    LOS: 8 days   [name], Med Student 11/21/2014, 8:44 AM

## 2014-11-21 NOTE — Progress Notes (Signed)
[facility] PROGRESS NOTE Subjective patient having bowel movements and tolerating clear liquids with abdominal pain markedly improved. She is passing flatus in her NG tube has been removed.  Objective: Vital signs in last 24 hours: Temp:  [97.9 F (36.6 C)-98.1 F (36.7 C)] 98.1 F (36.7 C) (06/16 0547) Pulse Rate:  [61-69] 69 (06/16 0547) Resp:  [16] 16 (06/16 0547) BP: (110-118)/(62-73) 110/62 mmHg (06/16 0547) SpO2:  [96 %-98 %] 98 % (06/16 0547) Last BM Date: 11/21/14  Intake/Output from previous day: 06/15 0701 - 06/16 0700 In: 1200 [P.O.:1200] Out: -  Intake/Output this shift:    PE:  Abdomen-- less distended minimally tender  Lab Results: No results for input(s): WBC, HGB, HCT, PLT in the last 72 hours. BMET  Recent Labs  11/21/14 0536  NA 135  K 4.1  CL 97*  CO2 29  CREATININE 0.73   LFT No results for input(s): PROT, AST, ALT, ALKPHOS, BILITOT, BILIDIR, IBILI in the last 72 hours. PT/INR No results for input(s): LABPROT, INR in the last 72 hours. PANCREAS No results for input(s): LIPASE in the last 72 hours.       Studies/Results: No results found.  Medications: I have reviewed the patient's current medications.  Assessment/Plan: 1. Crohn's disease of the terminal ileum with partial SBO. Appears to finally be improving with steroids. Patient seems to be doing adequately on oral therapy. Would send home on prednisone 60 mg daily. I discussed with her to the need to remain on a very low residue diet and slowly increase the fiber in her diet. Would have her see Dr. Cristina Gong in the office in 2 to 3 weeks for follow-up.  We will sign off please call us back for further problems   Bardia Wangerin JR,Naethan Bracewell L 11/21/2014, 4:19 PM  Pager: [phone] If no answer or after hours call [phone]

## 2014-11-21 NOTE — Progress Notes (Signed)
Subjective:   Day of hospitalization: 8  VSS.  Pt is sitting up in chair reporting she is much better today.  She denies nausea and is passing gas.  Had a small BM yesterday.  Tolerating clears.  Still expresses concern over her pain medications.  She would like to go home and states she knows she has to "take it easy."  She has used baby food in the past when she gets distended but also states that distention is "normal for her."   Objective:   Vital signs in last 24 hours: Filed Vitals:   11/20/14 0532 11/20/14 1300 11/20/14 2055 11/21/14 0547  BP: 116/68 110/63 118/73 110/62  Pulse: 64 64 61 69  Temp: 98.8 F (37.1 C) 97.7 F (36.5 C) 97.9 F (36.6 C) 98.1 F (36.7 C)  TempSrc: Oral Axillary Axillary Axillary  Resp: 17 17 16 16   Height:      Weight:      SpO2: 97% 97% 96% 98%    Weight: Filed Weights   11/15/14 1508 11/16/14 0542  Weight: 49.624 kg (109 lb 6.4 oz) 49.88 kg (109 lb 15.5 oz)    I/Os:  Intake/Output Summary (Last 24 hours) at 11/21/14 3154 Last data filed at 11/20/14 1849  Gross per 24 hour  Intake   1200 ml  Output      0 ml  Net   1200 ml    Physical Exam: Constitutional: Vital signs reviewed.  Patient is sitting up in a chair in no acute distress and cooperative with exam.   HEENT: [place]/AT; EOMI, conjunctivae normal, no scleral icterus ; NGT in place.  Cardiovascular: RRR, no MRG Pulmonary/Chest: normal respiratory effort, no accessory muscle use, CTAB, no wheezes, rales, or rhonchi Abdominal: Distention improved, +BS,  still with mild lower quadrant tenderness, no rebound or guarding; redness over the lower abdomen; mild umbilical hernia Neurological: A&O x3, CN II-XII grossly intact; non-focal exam Extremities: 2+DP b/l, no C/C/E  Skin: Warm, dry and intact.   Lab Results:  BMP:  Recent Labs Lab 11/16/14 0417 11/21/14 0536  NA 143 135  K 4.3 4.1  CL 109 97*  CO2 25 29  GLUCOSE 144* 129*  BUN 23* 14  CREATININE 0.75 0.73    CALCIUM 8.8* 7.9*  MG  --  2.0    CBC:  Recent Labs Lab 11/17/14 0330  WBC 21.0*  NEUTROABS 19.2*  HGB 10.3*  HCT 30.8*  MCV 78.0  PLT 446*   LFTs: No results for input(s): AST, ALT, ALKPHOS, BILITOT, PROT, ALBUMIN in the last 168 hours.  Pancreatic Enzymes: No results for input(s): LIPASE, AMYLASE in the last 168 hours.  Lactic Acid/Procalcitonin: No results for input(s): LATICACIDVEN, PROCALCITON in the last 168 hours. Cardiac Enzymes: No results for input(s): CKTOTAL, CKMB, CKMBINDEX, TROPONINI in the last 168 hours.  EKG: EKG Interpretation  Date/Time:    Ventricular Rate:    PR Interval:    QRS Duration:   QT Interval:    QTC Calculation:   R Axis:     Text Interpretation:    Urinalysis: No results for input(s): COLORURINE, LABSPEC, PHURINE, GLUCOSEU, HGBUR, BILIRUBINUR, KETONESUR, PROTEINUR, UROBILINOGEN, NITRITE, LEUKOCYTESUR in the last 168 hours.  Invalid input(s): APPERANCEUR  Micro Results: No results found for this or any previous visit (from the past 240 hour(s)).  Blood Culture: No results found for: SDES, SPECREQUEST, CULT, REPTSTATUS  Studies/Results: No results found.  Medications:  Scheduled Meds: . enoxaparin (LOVENOX) injection  40 mg Subcutaneous  Q24H  . feeding supplement (RESOURCE BREEZE)  1 Container Oral TID BM  .  HYDROmorphone (DILAUDID) injection  2 mg Intravenous 6 times per day  . predniSONE  60 mg Oral Q breakfast   Continuous Infusions:   PRN Meds: acetaminophen, sodium chloride  Antibiotics: Antibiotics Given (last 72 hours)    None      Day of Hospitalization: 8  Consults: Treatment Team:  Ronald Lobo, MD Wonda Horner, MD  Assessment/Plan:   Principal Problem:   Partial small bowel obstruction Active Problems:   Crohn's colitis   Reactive thrombocytosis   Long-term current use of methadone for opiate dependence  Partial SBO Pt continues to slowly mprove.  No nausea yesterday and tolerating  clears.  Is passing gas and had a small BM yesterday.  Active BS with distention improved.  She was clamped all day yesterday.  -cont to monitor  -will obtain XR Abd before re-consulting surgery if worsening  -surgery and GI consulted, recommended conservative mgmt for now  -remove NGT -cont scheduled dilaudid   -d/c solumedrol -add prednisone 60mg  qd -full liquids  Crohn's colitis Pt has presumed Crohn's and didn't follow up Dr. Cristina Gong.  CT Abd shows finding c/w active crohn's and partial SBO.  CRP  -per above  Chronic constipation  Pt takes miralax daily to help with constipation likely induced by chronic methadone, but has not taken it here.  Had several BM in recent days.      -cont to monitor    Dyspnea  Improved, is not required O2 currently.  Likely d/t atx but also the NGT may be contributing.  She is afebrile without tachycardia or LE swelling.  Doubt PNA given afebrile and PE unlikely as pt has been on anticoagulation without tachycardia.  She has been using IS and ambulating.   -IS -mobilize to chair and ambulate   Long term use of methadone for opioid addiction Has been on methadone 75mg  daily for 2 years prescribed by [facility].  She is concerned about withdrawing stating she had some symptoms such as diaphoresis and anxiety.  I think much of this is related to her concern about getting the scheduled dilaudid which I have written strict orders for her to be given this scheduled and not PRN.  We discussed that increasing her dosing would only cause a setback and make her problem worse.  We encouraged her to take a walk when it is close to her getting another dose.  She agreed to try this.   -cont dilaudid 2mg  q4h as methadone has a much longer half life   -will resume methadone at a lower dose of 30mg  today at 4AM with dilauid PRN breakthrough   Tthrombocytosis Likely reactive d/t ongoing inflammation/infection.   -cont to monitor   F/E/N Fluids- d/c IVF    Electrolytes- Replete as needed  Nutrition- Full liquids   VTE PPx  -cont lovenox   Disposition Disposition is deferred, awaiting improvement of current medical problems.  Anticipated discharge in approximately 1-2 day(s).     LOS: 8 days   Jones Bales, MD PGY-2, Internal Medicine Teaching Service 11/21/2014, 8:08 AM

## 2014-11-22 MED ORDER — METHADONE HCL 10 MG/ML PO CONC: 40.0000 mg | Freq: Every day | ORAL | Status: DC

## 2014-11-22 MED ORDER — HYDROMORPHONE HCL 2 MG PO TABS: 4.0000 mg | ORAL_TABLET | ORAL | Status: DC | PRN

## 2014-11-22 MED ORDER — HYDROMORPHONE HCL 1 MG/ML IJ SOLN
1.0000 mg | INTRAMUSCULAR | Status: DC | PRN
  Filled 2014-11-22: qty 2

## 2014-11-22 MED ORDER — HYDROMORPHONE HCL 1 MG/ML IJ SOLN: 2.0000 mg | INTRAMUSCULAR | Status: DC | PRN

## 2014-11-22 MED ORDER — METHADONE HCL 10 MG PO TABS: 40.0000 mg | ORAL_TABLET | Freq: Every day | ORAL | Status: DC

## 2014-11-22 MED ORDER — METHADONE HCL 10 MG/ML PO CONC: 30.0000 mg | Freq: Every day | ORAL | Status: DC

## 2014-11-22 MED ORDER — METHADONE HCL 10 MG PO TABS
40.0000 mg | ORAL_TABLET | Freq: Every day | ORAL | Status: DC
  Administered 2014-11-23: 40 mg via ORAL
  Filled 2014-11-22: qty 4

## 2014-11-22 MED ORDER — METHADONE HCL 10 MG PO TABS
40.0000 mg | ORAL_TABLET | Freq: Every day | ORAL | Status: DC
  Administered 2014-11-22: 10 mg via ORAL
  Filled 2014-11-22: qty 4

## 2014-11-22 NOTE — Progress Notes (Signed)
Patient's iv was leaking. Dc'd Iv. Patient refused iv restart at this time. Ludwig Clarks, RN

## 2014-11-22 NOTE — Progress Notes (Signed)
Subjective:   Day of hospitalization: 9  VSS.  Pt is sitting up in chair and is very anxious about her recent decrease in methadone dosing.  She was weaned down yesterday from methadone 75mg  daily taken prior to admission to 30mg  overnight.  She was also given diluadid for breakthrough symptoms which she did not take because she was afraid it would "interrupt her recovery and would become addicted to diluaid."  What she doesn't seem to understand is that the long-term use of methadone is what is hampering her recovery.  Otherwise, she feels good and has been tolerating full liquids yesterday without nausea or abdominal pain.  She is excited to eat fish today.    Objective:   Vital signs in last 24 hours: Filed Vitals:   11/21/14 0547 11/21/14 1415 11/21/14 2140 11/22/14 0530  BP: 110/62 111/64 100/54 101/57  Pulse: 69 64 72 79  Temp: 98.1 F (36.7 C) 98.2 F (36.8 C) 98.2 F (36.8 C) 98.6 F (37 C)  TempSrc: Axillary Oral Oral Oral  Resp: 16 17 17 18   Height:      Weight:      SpO2: 98% 98% 97% 97%    Weight: Filed Weights   11/15/14 1508 11/16/14 0542  Weight: 49.624 kg (109 lb 6.4 oz) 49.88 kg (109 lb 15.5 oz)    I/Os: No intake or output data in the 24 hours ending 11/22/14 1048  Physical Exam: Constitutional: Vital signs reviewed.  Patient is sitting up in a chair in no acute distress and cooperative with exam.   HEENT: [place]/AT; EOMI, conjunctivae normal, no scleral icterus ; NGT in place.  Cardiovascular: RRR, no MRG Pulmonary/Chest: normal respiratory effort, no accessory muscle use, CTAB, no wheezes, rales, or rhonchi Abdominal: Distention worsened from yesterday, +BS,  still with mild lower quadrant tenderness, no rebound or guarding; redness over the lower abdomen; mild umbilical hernia Neurological: A&O x3, CN II-XII grossly intact; non-focal exam Extremities: 2+DP b/l, no C/C/E  Skin: Warm, dry and intact.  Psych: She appears very anxious   Lab  Results:  BMP:  Recent Labs Lab 11/16/14 0417 11/21/14 0536  NA 143 135  K 4.3 4.1  CL 109 97*  CO2 25 29  GLUCOSE 144* 129*  BUN 23* 14  CREATININE 0.75 0.73  CALCIUM 8.8* 7.9*  MG  --  2.0    CBC:  Recent Labs Lab 11/17/14 0330  WBC 21.0*  NEUTROABS 19.2*  HGB 10.3*  HCT 30.8*  MCV 78.0  PLT 446*   LFTs: No results for input(s): AST, ALT, ALKPHOS, BILITOT, PROT, ALBUMIN in the last 168 hours.  Pancreatic Enzymes: No results for input(s): LIPASE, AMYLASE in the last 168 hours.  Lactic Acid/Procalcitonin: No results for input(s): LATICACIDVEN, PROCALCITON in the last 168 hours. Cardiac Enzymes: No results for input(s): CKTOTAL, CKMB, CKMBINDEX, TROPONINI in the last 168 hours.  EKG: EKG Interpretation  Date/Time:    Ventricular Rate:    PR Interval:    QRS Duration:   QT Interval:    QTC Calculation:   R Axis:     Text Interpretation:    Urinalysis: No results for input(s): COLORURINE, LABSPEC, PHURINE, GLUCOSEU, HGBUR, BILIRUBINUR, KETONESUR, PROTEINUR, UROBILINOGEN, NITRITE, LEUKOCYTESUR in the last 168 hours.  Invalid input(s): APPERANCEUR  Micro Results: No results found for this or any previous visit (from the past 240 hour(s)).  Blood Culture: No results found for: SDES, SPECREQUEST, CULT, REPTSTATUS  Studies/Results: No results found.  Medications:  Scheduled  Meds: . enoxaparin (LOVENOX) injection  40 mg Subcutaneous Q24H  . feeding supplement (RESOURCE BREEZE)  1 Container Oral TID BM  . [START ON 11/23/2014] methadone  40 mg Oral Daily  . predniSONE  60 mg Oral Q breakfast   Continuous Infusions:   PRN Meds: acetaminophen, HYDROmorphone (DILAUDID) injection, sodium chloride  Antibiotics: Antibiotics Given (last 72 hours)    None      Day of Hospitalization: 9  Consults: Treatment Team:  Ronald Lobo, MD Wonda Horner, MD  Assessment/Plan:   Principal Problem:   Partial small bowel obstruction Active Problems:    Crohn's colitis   Reactive thrombocytosis   Long-term current use of methadone for opiate dependence   Malnutrition of moderate degree  Partial SBO Pt continues to slowly mprove.  No nausea yesterday and tolerating full liquids.  Is passing gas and had a small BM yesterday.  Active BS with slightly worsened distention.   NGT removed yesterday.   -cont to monitor  -will obtain XR Abd before re-consulting surgery if worsening  -surgery and GI consulted, recommended conservative mgmt for now  -cont dilaudid PRN  -cont prednisone 60mg  qd -will advance to soft diet this AM   Crohn's colitis Pt has presumed Crohn's and didn't follow up Dr. Cristina Gong.  CT Abd shows finding c/w active crohn's and partial SBO.  CRP  -per above  Chronic constipation  Pt takes miralax daily to help with constipation likely induced by chronic methadone, but has not taken it here.  Had several BM in recent days.      -cont to monitor    Dyspnea  Resolved, is not required O2 currently.  Likely d/t atx but also the NGT may be contributing.  She is afebrile without tachycardia or LE swelling.  Doubt PNA given afebrile and PE unlikely as pt has been on anticoagulation without tachycardia.  She has been using IS and ambulating.   -IS -mobilize to chair and ambulate   Long term use of methadone for opioid addiction Has been on methadone 75mg  daily for 2 years prescribed by [facility].  She is concerned about withdrawing stating she had some symptoms such as diaphoresis and anxiety.  We discussed that increasing her dosing would only cause a setback and make her problem worse.  We encouraged her to take a walk when it is close to her getting another dose.  She agreed to try this.   -cont dilaudid 1-2mg  q4h PRN for withdrawal symptoms with parameters   -will increase methadone to 40mg  today at 4AM with cont dilauid PRN breakthrough with parameters  -will consult CSW for crisis counseling   Tthrombocytosis Likely  reactive d/t ongoing inflammation/infection.   -cont to monitor   F/E/N Fluids- d/c IVF  Electrolytes- Replete as needed  Nutrition- Soft diet  VTE PPx  -cont lovenox   Disposition Disposition is deferred, awaiting improvement of current medical problems.  Anticipated discharge in approximately 1-2 day(s).     LOS: 9 days   Jones Bales, MD PGY-2, Internal Medicine Teaching Service 11/22/2014, 10:48 AM

## 2014-11-22 NOTE — Progress Notes (Signed)
Subjective: The patient is tolerating a soft mechanical diet well. She had one small BM last night.  She had some pain early this morning but said that she is reluctant to take any of the PRN dilaudid that she has available because she is afraid that this might "set her back". She is increasingly anxious about the reduction in her methadone dose from her home 75 to 30mg  that she got this morning. She says that she is not in any discomfort now but is anticipating that she will be in pain later in the day and that is making her nervous. We put forward the idea of CBT as an outpatient to deal with her anxieties and she was agreeable to it.    Objective: Vital signs in last 24 hours: Filed Vitals:   11/21/14 0547 11/21/14 1415 11/21/14 2140 11/22/14 0530  BP: 110/62 111/64 100/54 101/57  Pulse: 69 64 72 79  Temp: 98.1 F (36.7 C) 98.2 F (36.8 C) 98.2 F (36.8 C) 98.6 F (37 C)  TempSrc: Axillary Oral Oral Oral  Resp: 16 17 17 18   Height:      Weight:      SpO2: 98% 98% 97% 97%   General: sitting up in chair, eating, in no acute distress, non diaphoretic Cardiac: RRR, no rubs, murmurs or gallops Pulm: clear to auscultation bilaterally, no crackles, wheezes, or rhonchi, moving normal volumes of air Abd: mildly distended, more distended compared to yesterday, quiet BS, mildly tender to palpation throughout , skin discoloration below umbilicus consistent with previous exam Ext: warm and well perfused, trace pedal edema, 2+ pulses   Medications: I have reviewed the patient's current medications. Scheduled Meds: . enoxaparin (LOVENOX) injection  40 mg Subcutaneous Q24H  . feeding supplement (RESOURCE BREEZE)  1 Container Oral TID BM  . [START ON 11/23/2014] methadone  40 mg Oral Daily  . predniSONE  60 mg Oral Q breakfast   Continuous Infusions:  PRN Meds:.acetaminophen, HYDROmorphone (DILAUDID) injection, sodium chloride Assessment/Plan: Principal Problem:   Partial small bowel  obstruction Active Problems:   Crohn's colitis   Reactive thrombocytosis   Long-term current use of methadone for opiate dependence   Malnutrition of moderate degree   Krystal [name] is a 54 yo F with a h/o diagnosed Crohn's disease, not on any therapy at home, who presents with abdominal pain, nausea, and anorexia. CT abdomen showed findings consistent with active Crohn's disease in the distal small bowel and a partial small bowel obstruction, due to either active inflammation vs stricture from chronic uncontrolled Crohn's.   Chron's colitis Pt with Crohn's diagnosis since 2011, not on any therapy at home. CT abdomen consistent with active Crohn's disease throughout distal small bowel, including the terminal ileum and cecum. CRP elevated at 14.4. Leukocytosis on CBC likely due to demargination s/p IV steroids. Thrombocytopenia on CBC is most likely reactive. Patient is tolerating soft diet well. One small BM ON. Denies n/v.  - GI consulted, appreciate recs - soft diet  - po prednisone60 mg - d/c'ed IV Ciprofloxacin, Flagyl  - Tylenol prn for pain   Partial SBO Visualized on CT. Patient is passing flatus. Her abdominal distention is much improved since admission. No evidence of peritoneal signs or concern for perforation at this point.NGT out. Tolerating soft diet well. No n/n. Abdominal exam today with quiet BS, a bit more distention.  - surgery consulted: will continue to monitor, conservative medical management for now  - soft diet  Current use of methadone for opiate  dependence Patient is on 52 methadone for h/o opioid dependence. Follows with [facility]. Has been on current regimen for 2 years. Methadone might be playing a role in her h/o chronic constipation.  - will increase methadone from 30 to 40 mg  - dilaudid 2mg  q4hr for breakthrough pain - we advised that CBT might be beneficial as an outpatient to deal with the anxiety around her GI sxs and methadone  use. The patient was agreeable. Will set up outpatient follow up with therapist. Will CSW around this matter as well.   SOB Resolved. Patient with subjective dyspnea since admission. Currently on 2L by [place], with saturations in the low 90s. Denies chest pain, leg pain or swelling. VSS. Most likely due to atelectasis in setting of immobilization, pain.  - On RA - IS -OOB to chair -ambulate   DVT Ppx -lovenox 40 subq  FENGI: -Soft  diet  -dc'ed IV fluids - Replete electrolytes as needed.   This is a Careers information officer Note.  The care of the patient was discussed with Dr. Gordy Levan and the assessment and plan formulated with their assistance.  Please see their attached note for official documentation of the daily encounter.   LOS: 9 days   Krystal [name], Med Student 11/22/2014, 10:38 AM

## 2014-11-23 DIAGNOSIS — K50812 Crohn's disease of both small and large intestine with intestinal obstruction: Secondary | ICD-10-CM

## 2014-11-23 MED ORDER — ACETAMINOPHEN 325 MG PO TABS
650.0000 mg | ORAL_TABLET | Freq: Four times a day (QID) | ORAL | Status: DC | PRN
Start: 2014-11-23 — End: 2014-11-23

## 2014-11-23 MED ORDER — PREDNISONE 20 MG PO TABS: ORAL_TABLET | ORAL | Status: DC

## 2014-11-23 MED ORDER — METHADONE HCL 10 MG/ML PO CONC: 40.0000 mg | Freq: Every day | ORAL | Status: AC

## 2014-11-23 MED ORDER — BOOST / RESOURCE BREEZE PO LIQD: 1.0000 | Freq: Three times a day (TID) | ORAL | Status: DC

## 2014-11-23 MED ORDER — PREDNISONE 20 MG PO TABS
ORAL_TABLET | ORAL | Status: DC
Start: 2014-11-23 — End: 2014-11-23

## 2014-11-23 NOTE — Discharge Instructions (Signed)
Please keep your follow-up appointments; this is very important for your continued recovery.    For therapists skilled in CBT, call [facility]- [phone]].  We have made the following additions/changes to your medications:  Please refer to your current medication list located at the front of this summary.    Please continue to take all of your medications as prescribed.  Do not miss any doses without contacting your primary physician.  If you have questions, please contact your physician or contact the [place] at [phone].  Please bring your medicications with you to your appointments; medications may be eye drops, herbals, vitamins, or pills.    If you believe you are suffering from a life-threatening emergency, go to the nearest Emergency Department.      Liz [employer]iborne Guide  1) Find a Charity fundraiser Although you won't have to find out who is covered by [employer] plan, it is a good idea to ask around and get recommendations. You will then need to call the office and see if the doctor you have chosen will accept you as a new patient and what types of options they offer for patients who are self-pay. Some doctors offer discounts or will set up payment plans for their patients who do not have insurance, but you will need to ask so you aren't surprised when you get to your appointment.  2) Contact Your Local Health Department Not all health departments have doctors that can see patients for sick visits, but many do, so it is worth a call to see if yours does. If you don't know where your local health department is, you can check in your phone book. The CDC also has a tool to help you locate your state's health department, and many state websites also have listings of all of their local health departments.  3) Find a [facility] If your illness is not likely to be very severe or complicated, you may want to try a walk in clinic.  These are popping up all over the country in pharmacies, drugstores, and shopping centers. They're usually staffed by nurse practitioners or physician assistants that have been trained to treat common illnesses and complaints. They're usually fairly quick and inexpensive. However, if you have serious medical issues or chronic medical problems, these are probably not your best option.  No Primary Care Doctor: - Call Health Connect at  [phone] - they can help you locate a primary care doctor that  accepts your insurance, provides certain services, etc. - Physician Referral Service- [phone]  Chronic Pain Problems: Organization         Address  Phone   Notes  [facility]  [phone] Patients need to be referred by their primary care doctor.   Medication Assistance: Organization         Address  Phone   Notes  [facility] Medication [facility] [address] [place], [place] [address] [phone] --Must be a resident of [facility] -- Must have NO insurance coverage whatsoever (no Medicaid/ Medicare, etc.) -- The pt. MUST have a primary care doctor that directs their care regularly and follows them in the community   MedAssist  [phone]   [employer]  [phone]    Agencies that provide inexpensive medical care: Organization         Address  Phone   Notes  [place]  [phone]  [facility]    [phone]   [facility] [place], [place] [address] [phone]   [place]. [address] [place] [phone]   Planned Parenthood    ([phone]   [facility]    [phone]   [place] and [place] [address], [place] Phone:  [phone], Fax:  [phone] Hours of Operation:  9 am - 6 pm, M-F.  Also accepts Medicaid/Medicare and self-pay.  [facility] for  [place], [address], [place] Phone: [phone], Fax: [phone]. Hours of Operation:  8:30 am - 5:30 pm, M-F.  Also accepts Medicaid and self-pay.  [facility] [place] [address] [place] Phone: [phone]   [place], [place], [place] [phone], Ext. [phone] Mondays & Thursdays: 7-9 AM.  First 15 patients are seen on a first come, first serve basis.    [place] Providers:  Organization         Address  Phone   Notes  [facility] [address], [place] [phone] Also accepts self-pay patients.  [facility] [address] [place], [place]  [phone]   [place], [address], [place] [phone]   [facility] [address], [place] [phone]   Lucianne Lei [address], [place]   [phone] Only accepts [place] Access [place] patients after they have their name applied to their card.   Self-Pay (no insurance) in [facility]:  Organization         Address  Phone   Notes  Sickle Cell Patients, [facility] [place] [phone]   [facility] [place] [phone]   [facility] [place]  [place], [place], [place] [phone]   [facility]/Dr. Osei-Bonsu  [address], [place] or [address], [place] [phone] Phone number for both [place] and [place] locations is the same.  Urgent Medical and [facility] [address] [place] [phone]   [facility] [address] [place] or [address] [phone]   [facility] [address] [place] [phone], phone; [phone], fax Sees patients 1st  and 3rd Saturday of every month.  Must not qualify for public or private insurance (i.e. Medicaid, Medicare, [place] Health Choice, Veterans' Benefits)  Household income should be no more than 200% of the poverty level The clinic cannot treat you if you are pregnant or think you are pregnant  Sexually transmitted diseases are not treated at the clinic.    Dental Care: Organization         Address  Phone  Notes  [facility], [place] Department of [facility] [place] [phone] Accepts children up to age 59 who are enrolled in [place] or [place]; pregnant women with a Medicaid card; and children who have applied for Medicaid or [place] Health Choice, but were declined, whose parents can pay a reduced fee at time of service.  [facility] Department of [facility]  [address],  [place] [phone] Accepts children up to age 70 who are enrolled in Medicaid or [place] Health Choice; pregnant women with a Medicaid card; and children who have applied for Medicaid or [place] Health Choice, but were declined, whose parents can pay a reduced fee at time of service.  [facility]  [place] [phone] Patients are seen by appointment only. Walk-ins are not accepted. [place] will see patients 21 years of age and older. Monday - Tuesday (8am-5pm) Most Wednesdays (8:30-5pm) $30 per visit, cash only  [facility] Adult [facility] Access PRO[facility]  [address], [facility] [phone] Patients are seen by appointment only. Walk-ins are not accepted. [place] will see patients 56 years of age and older. One Wednesday Evening (Monthly: Volunteer Based).  $30 per visit, cash only  [place]  [phone] for adults; Children under age 58, call Graduate Pediatric Dentistry at [phone]. Children aged 30-14, please call [phone] to request a pediatric  application.  Dental services are provided in all areas of dental care including fillings, crowns and bridges, complete and partial dentures, implants, gum treatment, root canals, and extractions. Preventive care is also provided. Treatment is provided to both adults and children. Patients are selected via a lottery and there is often a waiting list.   [facility] [address] [place]  [phone] [email or web address]   [facility] [address] [place], [place] [phone], Ext. [phone] Second and Fourth Thursday of each month, opens at 6:30 AM; Clinic ends at 9 AM.  Patients are seen on a first-come first-served basis, and a limited number are seen during each clinic.   [facility]  [address] Hillard Danker [place], [place] [phone]   Eligibility Requirements You must have lived in [place], [place], or [place] counties for at least the last three months.   You cannot be eligible for state or federal sponsored [employer], including [employer], [place], or [employer].   You generally cannot be eligible for healthcare insurance through your employer.    How to apply: Eligibility screenings are held every Tuesday and Wednesday afternoon from 1:00 pm until 4:00 pm. You do not need an appointment for the interview!  [facility] [address] [place], [place]   [place]  [place] Department  [place]  [phone]    Behavioral Health Resources in the Community: Intensive Outpatient Programs Organization         Address  Phone  Notes  [place]. [address], [place], [place] [phone]   [facility] Outpatient [address], [place], [place]   ADS: Alcohol & Drug Svcs [address], [place], [place]   [place]  [address],  [place], [place] or [phone]   Substance Abuse Resources Organization         Address  Phone  Notes  Alcohol and Drug Services  [phone]   [place]  [phone]   [facility] [place]   Chinita Pester  [phone]   Residential & Outpatient Substance Abuse Program  [phone]    Psychological Services Organization         Address  Phone  Notes  [place]  [place]  [place] [address], [place] or [phone]    Mobile Crisis Teams Organization         Address  Phone  Notes  [facility], Mobile Crisis Care Unit  [phone]   [facility]otherapeutic [facility]  [address] [place], [place]   [place] Levels [address], [place] [phone]    Self-Help/Support Groups Organization         Address  Phone             Notes  [place]. of [place] - variety of support groups  [place] Call for more information  Narcotics Anonymous (NA), Caring [facility] [address], [employer] [place]  2 meetings at this location   Special educational needs teacher         Address  Phone  Notes  ASAP Residential Treatment [place],    [place]  [phone]   [facility]  [address] [place] [address], [place], [place]   [place], [place] [phone] Admissions: 8am-3pm M-F  Incentives Substance [place] [address]    [place], [place] [phone]   [facility] [address] [place], [place], [place]   [facility] [address]  [place], [place]   Insight Programs - Intensive Outpatient [address] Kristeen [address], [place], [place]   [facility] [place].,    [place], [place] [phone] or [phone]   [facility] (RTS) [address], [place], [place] Accepts Medicaid  [facility] [place] [address]  [place] [phone] Substance Abuse/Addiction Treatment   [facility] Organization         Address  Phone  Notes  [facility]  [phone]   Domenic Schwab, PhD [address] Arlis Porta [place], [place]   [phone] or [phone]   [facility] K[place]ter [place], [place] [phone]   [facility] [address] [place], [place] [phone] Insurance/Medicaid/sponsorship through [facility] and Families [address], [address] [place]                                    [place], [place] [phone] [place] [address], [place] [phone]    Dr. Adele Schilder  [phone]   [facility] Dept. 1) [address], [place] 2) [place] 3)  [place] [address], [place] [phone]  [phone]   [place] [phone] or [phone] (After Hours)

## 2014-11-23 NOTE — Discharge Summary (Signed)
Patient Name: Krystal [name]  MRN:  [id number]   DOB: Dec 08, 1960   PCP: No Pcp Per Patient         Date of Admission: 11/13/2014  Date of Discharge: 11/23/2014        Attending Physician: Krystal Belt, MD      DISCHARGE DIAGNOSES: Primary: Crohn's colitis  Secondary: Partial small bowel obstruction Long term use of methadone for opiate dependence    DISPOSITION AND FOLLOW-UP: Krystal [name] is to follow-up with the listed providers as detailed below, at which time, the following should be addressed:   1. GI follow up- treatment of her Crohn's. 2. Surgery follow up- the patient was treated medically for a partial small bowel obstruction. Please assess need for surgical management.   3. Labs / imaging needed at time of follow-up: none   4. Pending labs/ test needing follow-up: none     DISCHARGE INSTRUCTIONS: Follow-up Information    Schedule an appointment as soon as possible for a visit with [facility].   Why:  a hospital follow up appointment   Contact information:   [place] [address] [phone]       Schedule an appointment as soon as possible for a visit with [place].   Specialty:  General Surgery   Why:  a hospital follow up appointment   Contact information:   [address] [place] [address] [phone]       DISCHARGE MEDICATIONS:   Medication List    TAKE these medications        feeding supplement (RESOURCE BREEZE) Liqd  Take 1 Container by mouth 3 (three) times daily between meals.     methadone 10 MG/ML solution  Commonly known as:  DOLOPHINE  Take 4 mLs (40 mg total) by mouth daily.     polyethylene glycol packet  Commonly known as:  MIRALAX / GLYCOLAX  Take 17 g by mouth daily.     predniSONE 20 MG tablet  Commonly known as:  DELTASONE  3 tabs po daily x 5 days, then 2 tabs x 5 days, then 1.5 tabs x 5 days, then 1 tab x 5 days, then 0.5  tabs x 5 days        CONSULTS:  Treatment Team:  Krystal Lobo, MD Krystal Horner, MD    PROCEDURES PERFORMED:  Ct Abdomen Pelvis W Contrast  11/13/2014   CLINICAL DATA:  Severe generalized abdominal pain. Nausea. Crohn's disease.  EXAM: CT ABDOMEN AND PELVIS WITH CONTRAST  TECHNIQUE: Multidetector CT imaging of the abdomen and pelvis was performed using the standard protocol following bolus administration of intravenous contrast.  CONTRAST:  66mL OMNIPAQUE IOHEXOL 300 MG/ML  SOLN  COMPARISON:  08/13/2009  FINDINGS: Lower Chest:  Unremarkable.  Hepatobiliary: No masses identified. Mild periportal edema noted. Gallbladder is unremarkable.  Pancreas: No mass, inflammatory changes, or other significant abnormality identified.  Spleen:  Within normal limits in size and appearance.  Adrenals:  No masses identified.  Kidneys/Urinary Tract:  No evidence of masses or hydronephrosis.  Stomach/Bowel/Peritoneum: Mild to moderate dilatation of mid and distal small bowel loops is seen. There is moderate wall thickening and abnormal mucosal enhancement seen multiple loops of ileum within the right abdomen and pelvis, including the terminal ileum. Adjacent inflammatory changes are seen within the right abdominal and pelvic mesentery and there is also mild wall thickening involving the cecum and ascending colon. This  is consistent with active Crohn's disease, with associated partial small bowel obstruction.  Mild to moderate ascites is seen in the both upper quadrants, right paracolic gutter, and pelvis, however no focal abscesses visualized. Small hiatal hernia also noted.  Vascular/Lymphatic: Mild lymphadenopathy is seen in the central small bowel mesentery, likely reactive in etiology. No other sites of lymphadenopathy identified.  Reproductive: Prior hysterectomy noted. No adnexal masses identified  Other:  None.  Musculoskeletal:  No suspicious bone lesions identified.  IMPRESSION: Findings consistent with active  Crohn's disease involving multiple distal small bowel loops including the terminal ileum, and the cecum. This is causing a partial small bowel obstruction.  Mild to moderate ascites mainly in the right abdomen and pelvis. No focal abscess or free air identified.  Mild lymphadenopathy in small bowel mesentery, likely reactive in etiology.   Electronically Signed   By: Krystal [name] M.D.   On: 11/13/2014 07:20   Dg Abd 2 Views  11/17/2014   CLINICAL DATA:  Right lower quadrant abdominal pain. Small-bowel obstruction. Crohn's disease.  EXAM: ABDOMEN - 2 VIEW  COMPARISON:  CT abdomen and pelvis 11/13/2014  FINDINGS: An enteric tube has been placed with tip projecting over the distal thoracic esophagus. No intraperitoneal free air is identified. There is moderate dilatation of multiple small bowel loops in the central and left abdomen measuring up to approximately 4.5 cm in diameter and overall stable to slightly more prominent than on the prior study. A small amount of gas is again seen in the colon.  Opacities in the lung bases likely represent small pleural effusions and subsegmental atelectasis.  IMPRESSION: 1. Enteric tube terminates in the distal thoracic esophagus. Recommend advancing by approximately 10 cm (15 cm if side hole placement in the stomach is desired). 2. Persistent small bowel dilatation consistent with partial obstruction.   Electronically Signed   By: Krystal [name]   On: 11/17/2014 16:24       ADMISSION DATA: H&P: Krystal [name] is a 16 yr woman with a pmh of Crohns disease, not on any treatment, who presents with ongoing RLQ pain, nausea, and anorexia x2wks. The patient has been diagnosed with Crohn's disease several years ago by Krystal [name], but has not had any more follow up and has not received any treatment for her Crohn's due to not having medical insurance. Since that time she has had bouts of pain and some anorexia/nausea that usually resolve in 1-2 wks. She occasionally sought help at  urgent care centers for her sxs. 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 She has only been tolerating baby food. She states that the intensity of the pain got worse on the night of admission, along with worsening nausea, chills that caused her to present for evaluation. She denied any BRBPR, hematochezia, hematemesis, chest pain, inability to pass flatus, constipation, or skin rashes. Pt denied any recent illnesses. She smokes a pack a day. She denies any alcohol use. She has been on 75mg  methadone x 2 years (followed by [facility]) for opiate dependence / pain management.  In the ED, she was found to be have a diffusely tender, distended abdomen. An abdominal CT revealed inflammatory changes consistent with Crohn's colitis and a partial small bowel obstruction.   Physical Exam: Blood pressure 128/68, pulse 99, resp. rate 30, SpO2 94 %. General:  resting in bed, uncomfortable, cachectic  HEENT: PERRL, EOMI, no scleral icterus Cardiac: RRR, no rubs, murmurs or gallops Pulm: clear to auscultation bilaterally, no crackles, wheezes, or rhonchi, moving normal volumes of air Abd: tense abdominal muscles, RLQ tenderness, some guarding, slightly distended, BS present Ext: warm and well perfused, no pedal edema Neuro: alert and oriented X3, cranial nerves II-XII grossly intact  Lab results: Basic Metabolic Panel:  Recent Labs (last 2 labs)      Recent Labs  11/13/14 0258  NA 132*  K 3.4*  CL 97*  CO2 23  GLUCOSE 273*  BUN 7  CREATININE 0.73  CALCIUM 8.6*     Liver Function Tests:  Recent Labs (last 2 labs)      Recent Labs  11/13/14 0258  AST 20  ALT 17  ALKPHOS 124  BILITOT 0.5  PROT 6.5  ALBUMIN 2.7*     CBC:  Recent Labs (last 2 labs)      Recent Labs   11/13/14 0258  WBC 9.9  NEUTROABS 8.2*  HGB 11.5*  HCT 34.5*  MCV 79.7  PLT 590*          HOSPITAL COURSE BY PROBLEM LIST: # Crohn's colitis with partial SBO- patient with a h/o diagnosed Crohn's disease, diagnosed several years ago by Krystal. Cristina [name], not on any therapy at home, who presented with worsening abdominal pain, nausea, and anorexia. She has a history of intermittent bouts of abdominal pain, distention, nausea and vomiting, that she has been managing at home by transitioning to a soft diet and visits to urgent care centers. On presentation to the ED, she was found to have a distended, tender abdomen. Her vitals were as follows: blood pressure 128/68, pulse 99, resp. rate 30, SpO2 94 %. A CT abdomen showed findings consistent with active Crohn's disease throughout distal small bowel, including the terminal ileum and cecum and a partial small bowel obstruction, due to either active inflammation vs stricture from chronic uncontrolled Crohn's. CRP was elevated at 14.4. CBC revealed thrombocytopenia to 590, which was most likely reactive, and a WBC of 9.9. Surgery and GI were consulted, and the decision was made to manage her medically. She was started on IV Ciprofloxacin, IV Flagyl, and IV Solumedrol. She was made NPO and placed on NG suction. We treated her symptomatically for pain and nausea. Her IV antibiotics were continued for 5 days. Once her abdominal distention improved and she started passing flatus, we discontinued NG suctioning and slowly advanced her diet. Her IV steroids were transitioned to prednisone 60 mg PO. Upon discharge she was tolerating a soft diet, passing flatus, having BMs, and her abdominal distention had considerably improved. She was discharged on a prednisone taper, and was instructed to follow up with GI as an outpatient for further management of her Crohn's. She received nutritional education on maintaining a low residue, low fiber diet.   Current use  of methadone for opiate dependence Patient has been on 75 mg methadone for h/o opioid dependence. [facility]. She has been on current regimen for 2 years. We felt that her chronic methadone use was playing a role in her decreased bowel motility. The patient reported that she is on daily Miralax at home for chronic constipation. Given her SBO, and inability to take PO, we transitioned her methadone to hydromorphone IV 2mg  q4h. She was restarted on methadone 30 mg once she started tolerating a liquid diet. This was changed to methadone 40mg  due to patient's  anxiety over reducing her methadone dose too drastically from her home  75mg . She tolerated the transition well and denied any withdrawal symptoms. She will follow up with the methadone clinic tomorrow to resume her treatment with them. They are aware that she was restarted on a lower dose at 40 mg and we discussed with the clinic that the patient will need to be weaned off of her methadone, given her medical condition. We also advised the patient that psychological counseling or CBT might be beneficial as an outpatient to deal with the anxiety around her GI sxs and methadone use. The patient was agreeable with the plan. She was given a list of primary care and mental health providers on discharge for further outpatient follow up.   DISCHARGE DATA: Vital Signs: BP 101/54 mmHg  Pulse 66  Temp(Src) 98.8 F (37.1 C) (Oral)  Resp 18  Ht 5\' 2"  (1.575 m)  Wt 49.88 kg (109 lb 15.5 oz)  BMI 20.11 kg/m2  SpO2 98%  PE: General: sitting up in chair, eating, in no acute distress, non diaphoretic Cardiac: RRR, no rubs, murmurs or gallops Pulm: clear to auscultation bilaterally, no crackles, wheezes, or rhonchi, moving normal volumes of air Abd: mildly distended, + BS in all 4 quadrants, mildly tender to palpation throughout, skin discoloration below umbilicus consistent with previous exam Ext: warm and well perfused, trace  pedal edema, 2+ pulses   Signed: Anaid Haney, Med Student   11/23/2014, 11:23 AM

## 2014-11-23 NOTE — Plan of Care (Signed)
Problem: Food- and Nutrition-Related Knowledge Deficit (NB-1.1) Goal: Nutrition education Formal process to instruct or train a patient/client in a skill or to impart knowledge to help patients/clients voluntarily manage or modify food choices and eating behavior to maintain or improve health.  Outcome: Adequate for Discharge Nutrition Education Note  RD consulted for nutrition education regarding low fiber diet.  RD provided "Finer Restricted Nutrition Therapy" handout from the Academy of [employer]. Reviewed patient's dietary recall. Provided examples on ways to decrease fiber intake in diet. Provided specific examples and substitutions of common foods and discussed how pt can modify diet to comply with diet restrictions. Reviewed recommended and non-recommended foods from each food group.  RD discussed why it is important for patient to adhere to diet recommendations,. Pt is highly motivated and had good questions.Teach back method used.  Expect very good compliance.  Body mass index is 20.11 kg/(m^2). Pt meets criteria for normal weight range based on current BMI.  Current diet order is soft, patient is consuming approximately 50% of meals at this time. Labs and medications reviewed.   Pt reports she is consuming the Resource Breeze supplements. RD provided resources of how pt can obtain via retail setting.  Per RN and MD, pt is for discharge today.  Nathanie Ottley A. Jimmye Norman, RD, LDN, CDE Pager: [phone] After hours Pager: [phone]

## 2014-11-23 NOTE — Progress Notes (Signed)
Subjective: The patient is feeling well today. Is able to tolerate soft diet. Denies n/v, abdominal pain. Is passing flatus and had BM last night. States that she wants to go home today.   Objective: Vital signs in last 24 hours: Filed Vitals:   11/22/14 0530 11/22/14 1337 11/22/14 2105 11/23/14 0526  BP: 101/57 96/53 94/58  101/54  Pulse: 79 79 59 66  Temp: 98.6 F (37 C) 98.9 F (37.2 C) 97 F (36.1 C) 98.8 F (37.1 C)  TempSrc: Oral Oral Oral Oral  Resp: 18 19 18 18   Height:      Weight:      SpO2: 97% 97% 98% 98%   General: sitting up in chair, eating, in no acute distress, non diaphoretic Cardiac: RRR, no rubs, murmurs or gallops Pulm: clear to auscultation bilaterally, no crackles, wheezes, or rhonchi, moving normal volumes of air Abd: mildly distended, + BS in all 4 quadrants, mildly tender to palpation throughout , skin discoloration below umbilicus consistent with previous exam Ext: warm and well perfused, trace pedal edema, 2+ pulses   Medications: I have reviewed the patient's current medications. Scheduled Meds: . enoxaparin (LOVENOX) injection  40 mg Subcutaneous Q24H  . feeding supplement (RESOURCE BREEZE)  1 Container Oral TID BM  . methadone  40 mg Oral Daily  . predniSONE  60 mg Oral Q breakfast   Continuous Infusions:  PRN Meds:.acetaminophen, acetaminophen, HYDROmorphone, sodium chloride Assessment/Plan: Principal Problem:   Partial small bowel obstruction Active Problems:   Crohn's colitis   Reactive thrombocytosis   Long-term current use of methadone for opiate dependence   Malnutrition of moderate degree    [name] is a 54 yo F with a h/o diagnosed Crohn's disease, not on any therapy at home, who presents with abdominal pain, nausea, and anorexia. CT abdomen showed findings consistent with active Crohn's disease in the distal small bowel and a partial small bowel obstruction, due to either active inflammation vs stricture from chronic  uncontrolled Crohn's.   Chron's colitis Pt with Crohn's diagnosis since 2011, not on any therapy at home. CT abdomen consistent with active Crohn's disease throughout distal small bowel, including the terminal ileum and cecum. CRP elevated at 14.4. Leukocytosis on CBC likely due to demargination s/p IV steroids. Thrombocytopenia on CBC is most likely reactive. Patient is tolerating soft diet well. One small BM ON. Denies n/v.  - GI consulted, appreciate recs - soft diet- tolerating well - po prednisone60 mg - d/c'ed IV Ciprofloxacin, Flagyl  - Tylenol prn for pain  - tolerating diet well, is stable for discharge today with follow up with GI as outpatient   Partial SBO Visualized on CT. Patient is passing flatus. Her abdominal distention is much improved since admission. No evidence of peritoneal signs or concern for perforation at this point.NGT out. Tolerating soft diet well. No n/V. Abdominal exam today with +BS.  - surgery consulted: will continue to monitor, conservative medical management for now  - tolerating soft dietwell - tolerating diet well, is stable for discharge today with follow up with GI as outpatient  Current use of methadone for opiate dependence Patient is on 75 methadone for h/o opioid dependence. Follows with [facility]. Has been on current regimen for 2 years. Methadone might be playing a role in her h/o chronic constipation.  -  Methadone 40 mgqd - dilaudid 2mg  q4hr for breakthrough pain- has not been using  - we advised that CBT might be beneficial as an outpatient to deal with  the anxiety around her GI sxs and methadone use. The patient was agreeable. Will CSW around this matter as well.  - Will f/u with methadone clinic tomorrow morning  At 6 am. Talked to nurse about plan of weaning of methadone  SOB Resolved. - On RA - IS -OOB to chair -ambulate   DVT Ppx -lovenox 40 subq  FENGI: -Soft diet  -dc'ed IV fluids  Dispo:  stable for discharge today  This is a Careers information officer Note.  The care of the patient was discussed with Dr. Gordy Levan and the assessment and plan formulated with their assistance.  Please see their attached note for official documentation of the daily encounter.   LOS: 10 days   [name], Med Student 11/23/2014, 9:53 AM

## 2014-11-23 NOTE — Progress Notes (Signed)
Subjective:   Day of hospitalization: 10  VSS.  Pt denies CP, SOB, nausea today.  She is sitting in the chair in street clothes ready to go home.  She tolerated fish yesterday and had a BM overnight.  Pain well controlled.  Discussed going home and she is ready.  We discussed her need to follow up with GI and surgery.  Wilhemena Durie will also provide her with information on obtaining a PCP.    Objective:   Vital signs in last 24 hours: Filed Vitals:   11/22/14 0530 11/22/14 1337 11/22/14 2105 11/23/14 0526  BP: 101/57 96/53 94/58  101/54  Pulse: 79 79 59 66  Temp: 98.6 F (37 C) 98.9 F (37.2 C) 97 F (36.1 C) 98.8 F (37.1 C)  TempSrc: Oral Oral Oral Oral  Resp: 18 19 18 18   Height:      Weight:      SpO2: 97% 97% 98% 98%    Weight: Filed Weights   11/15/14 1508 11/16/14 0542  Weight: 49.624 kg (109 lb 6.4 oz) 49.88 kg (109 lb 15.5 oz)    I/Os:  Intake/Output Summary (Last 24 hours) at 11/23/14 1145 Last data filed at 11/23/14 0935  Gross per 24 hour  Intake    480 ml  Output      0 ml  Net    480 ml    Physical Exam: Constitutional: Vital signs reviewed.  Patient is sitting up in a chair in no acute distress and cooperative with exam.  She is wearing street clothes and is in good spirits.   HEENT: [place]/AT; EOMI, conjunctivae normal, no scleral icterus Cardiovascular: RRR, no MRG Pulmonary/Chest: normal respiratory effort, no accessory muscle use, CTAB, no wheezes, rales, or rhonchi Abdominal: +BS,  mild lower quadrant tenderness, no rebound or guarding; redness over the lower abdomen; mild umbilical hernia Neurological: A&O x3, CN II-XII grossly intact; non-focal exam Extremities: 2+DP b/l, no C/C/E  Skin: Warm, dry and intact.  Psych: Normal mood.   Lab Results:  BMP:  Recent Labs Lab 11/21/14 0536  NA 135  K 4.1  CL 97*  CO2 29  GLUCOSE 129*  BUN 14  CREATININE 0.73  CALCIUM 7.9*  MG 2.0    CBC:  Recent Labs Lab 11/17/14 0330  WBC 21.0*    NEUTROABS 19.2*  HGB 10.3*  HCT 30.8*  MCV 78.0  PLT 446*   LFTs: No results for input(s): AST, ALT, ALKPHOS, BILITOT, PROT, ALBUMIN in the last 168 hours.  Pancreatic Enzymes: No results for input(s): LIPASE, AMYLASE in the last 168 hours.  Lactic Acid/Procalcitonin: No results for input(s): LATICACIDVEN, PROCALCITON in the last 168 hours. Cardiac Enzymes: No results for input(s): CKTOTAL, CKMB, CKMBINDEX, TROPONINI in the last 168 hours.  EKG: EKG Interpretation  Date/Time:    Ventricular Rate:    PR Interval:    QRS Duration:   QT Interval:    QTC Calculation:   R Axis:     Text Interpretation:    Urinalysis: No results for input(s): COLORURINE, LABSPEC, PHURINE, GLUCOSEU, HGBUR, BILIRUBINUR, KETONESUR, PROTEINUR, UROBILINOGEN, NITRITE, LEUKOCYTESUR in the last 168 hours.  Invalid input(s): APPERANCEUR  Micro Results: No results found for this or any previous visit (from the past 240 hour(s)).  Blood Culture: No results found for: SDES, SPECREQUEST, CULT, REPTSTATUS  Studies/Results: No results found.  Medications:  Scheduled Meds: . enoxaparin (LOVENOX) injection  40 mg Subcutaneous Q24H  . feeding supplement (RESOURCE BREEZE)  1 Container Oral TID BM  .  methadone  40 mg Oral Daily  . predniSONE  60 mg Oral Q breakfast   Continuous Infusions:   PRN Meds: acetaminophen, acetaminophen, HYDROmorphone, sodium chloride  Antibiotics: Antibiotics Given (last 72 hours)    None      Day of Hospitalization: 10  Consults: Treatment Team:  Ronald Lobo, MD Wonda Horner, MD  Assessment/Plan:   Principal Problem:   Partial small bowel obstruction Active Problems:   Crohn's colitis   Reactive thrombocytosis   Long-term current use of methadone for opiate dependence   Malnutrition of moderate degree  Partial SBO Pt much improved today, she is sitting in the chair with her street clothes on.  No nausea yesterday and tolerating a soft diet.  Is  passing gas and is having regular BM.  Active BS with mild abd tenderness. -cont prednisone 60mg  qd with slow taper  -cont to go slow with advancing diet -follow up with GI  -needs to be on chronic meds for Crohn's    Crohn's colitis Pt has presumed Crohn's and didn't follow up Dr. Cristina Gong.  CT Abd shows finding c/w active crohn's and partial SBO.   -per above  Chronic constipation  Pt takes miralax daily to help with constipation likely induced by chronic methadone, but has not taken it here.  Had several BM in recent days.      -cont to monitor    Dyspnea  Resolved, is not required O2 currently.  Likely d/t atx but also the NGT may be contributing.  She is afebrile without tachycardia or LE swelling.  Doubt PNA given afebrile and PE unlikely as pt has been on anticoagulation without tachycardia.  She has been using IS and ambulating.   -IS -mobilize to chair and ambulate   Long term use of methadone for opioid addiction Has been on methadone 75mg  daily for 2 years prescribed by [facility].  She is concerned about withdrawing stating she had some symptoms such as diaphoresis and anxiety.  We discussed that increasing her dosing would only cause a setback and make her problem worse.  We encouraged her to take a walk when it is close to her getting another dose.  She agreed to try this.   -cont dilaudid 1-2mg  q4h PRN for withdrawal symptoms with parameters   -will increase methadone to 40mg  today at 4AM with cont dilauid PRN breakthrough with parameters  -will consult CSW for crisis counseling   Tthrombocytosis Likely reactive d/t ongoing inflammation/infection.   -cont to monitor   F/E/N Fluids- d/c IVF  Electrolytes- Replete as needed  Nutrition- Soft diet  VTE PPx  -cont lovenox   Disposition Discharge today with follow up with GI and obtaining a PCP.     LOS: 10 days   Jones Bales, MD PGY-2, Internal Medicine Teaching Service 11/23/2014, 11:45 AM

## 2014-11-23 NOTE — Clinical Social Work Note (Signed)
Clinical Social Work Assessment  Patient Details  Name: Krystal [name] MRN: [id number] Date of Birth: Jun 17, 1960  Date of referral:  11/23/14               Reason for consult:  Mental Health Concerns, Substance Use/ETOH Abuse                Permission sought to share information with:  Family Supports Permission granted to share information::  Yes, Verbal Permission Granted  Name::     Krystal [name]   Agency::  n/a  Relationship::  Husband   Contact Information:  [phone]  Housing/Transportation Living arrangements for the past 2 months:  Single Family Home Source of Information:  Patient Patient Interpreter Needed:  None Criminal Activity/Legal Involvement Pertinent to Current Situation/Hospitalization:  No - Comment as needed Significant Relationships:  Other Family Members, Adult Children, Spouse, Friend Lives with:  Spouse Do you feel safe going back to the place where you live?  Yes Need for family participation in patient care:  No (Coment)  Care giving concerns:  Pt has no care giving concerns at this time  Facilities manager / plan:  CSW met with the patient at the bedside. CSW introduced self. Pt was aware of the consult and was waiting for CSW to complete assessment as,per Pt, Pt will be discharged today. CSW made Pt aware of the consult that was placed. Pt was also aware of the purpose for assessment. Pt stated that she has been receiving outpatient methadone treatment for Pt's pain associated with Crohn's disease.  Pt stated that she sought help when Pt's husband was unemployed and that she has regretted that decision. Pt stated that Pt has been working on weaning off the methadone. Pt stated, "I know that it will be difficult but now I can go to a provider for my Crohn's disease and cut down the number of flair-ups." Pt also stated, "I recently told my immediate family and now they are very supportive. I don't feel like telling others in my family or friends  because I don't feel like they need to know." Pt does seem committed to change and has a solid plan for Pt's recovery. Pt also stated that Pt will also stop smoking as Pt has "been off them for 11 days and I don't want to pick them back up." Pt knows that Pt will need to stay committed to her recovery plan and remain open and honest with her family. Pt stated it was "nice not to hide everything" and "I feel very supported now." Pt also requested information for outpatient mental health services for counseling. Pt feels the need for support during the weaning would be beneficial.     Employment status:  [employer] information:  Other (Comment Required) Psychologist, counselling) PT Recommendations:  Not assessed at this time Information / Referral to community resources:  Outpatient Psychiatric Care (Comment Required), Outpatient Substance Abuse Treatment Options (Pt wanted outpatient psychiatric resources for emotional support. CSW also gave resources for substance abuse support programs. )  Patient/Family's Response to care:  Pt was thankful for the resources and plans to follow up with several of the services.   Patient/Family's Understanding of and Emotional Response to Diagnosis, Current Treatment, and Prognosis:  Pt has a large amount of understanding of her addiction and is committed to her recovery process,   Emotional Assessment Appearance:  Appears older than stated age Attitude/Demeanor/Rapport:    Affect (typically observed):  Accepting, Anxious, Stable, Pleasant, Hopeful Orientation:  Alcohol / Substance use:  Other (Methadone program for pain associated with Chrohns desease ) Psych involvement (Current and /or in the community):  No (Comment)  Discharge Needs  Concerns to be addressed:  Substance Abuse Concerns Readmission within the last 30 days:  No Current discharge risk:  None Barriers to Discharge:  No Barriers Identified   [name] 11/23/2014, 11:54 AM

## 2014-11-23 NOTE — Progress Notes (Signed)
Pt verbally understands DC instructions, no questions asked

## 2014-11-25 NOTE — Progress Notes (Signed)
Patient ID: Krystal [name], female   DOB: [birth year], 54 y.o.   MRN: [id number] Medicine attending discharge note: I attest to the accuracy of the evaluation and discharge plan as recorded by resident physician Dr. Loyal Jacobson and acting intern Ms. Anna Corcimaru.  Clinical summary: 54 year old woman with chronic inflammatory bowel disease, Crohn's colitis, who has become habituated to narcotic analgesics to control painful flareups but has never had formal treatment for the disorder. She was admitted on June 9 with uncontrolled right lower quadrant abdominal pain. There was evidence for partial bowel obstruction with dilation of the mid and distal small bowel, moderate wall thickening and abnormal mucosal enhancement within the right abdominal and pelvic mesentery and mild wall thickening involving the cecum and ascending colon consistent with active Crohn's disease. Additional findings included mild to moderate ascites primarily in the right abdomen and pelvis. No focal abscess or free air. Mild reactive small bowel mesenteric adenopathy.  Hospital course: Symptomatic treatment initiated with nasogastric suction and hydration. She was seen in consultation by gastroenterology. A course of parenteral steroids and antibiotics initiated. She slowly improved with conservative treatment with decreased pain, and abdominal distention. She started to pass  gas and have small bowel movements. Residual gastric fluids decreased. Nasogastric tube was removed. Parenteral steroids converted to oral prednisone 60 mg daily. We had to stop her methadone and substitute with parenteral Dilaudid on a scheduled basis. She became increasingly anxious about her methadone dose. The acting intern communicated with the methadone clinic on a number of occasions. Close follow-up arranged after discharge. Once she was able to tolerate a soft liquid diet, oral methadone was resumed up to a dose of 40 mg daily at time of  discharge.  Disposition: Condition stable at time of discharge. She is tolerating a soft diet. She will follow-up with gastroenterology for more definitive treatment of her Crohn's disease. We will like to make a surgical referral as well so that the surgeons are familiar with her history and can assist if necessary for future complications. She will continue to follow at the methadone clinic. We feel she would definitely benefit from psychologic counseling to manage the anxiety associated with her narcotic dependence. There were no complications.

## 2015-02-15 ENCOUNTER — Inpatient Hospital Stay (HOSPITAL_COMMUNITY)
Admission: EM | Admit: 2015-02-15 | Discharge: 2015-02-20 | DRG: 386 | Disposition: A | Discharge status: Home / Self Care | Payer: Managed Care, Other (non HMO) | Attending: Oncology | Admitting: Oncology

## 2015-02-15 ENCOUNTER — Emergency Department (HOSPITAL_COMMUNITY): Payer: Managed Care, Other (non HMO)

## 2015-02-15 ENCOUNTER — Inpatient Hospital Stay (HOSPITAL_COMMUNITY): Payer: Managed Care, Other (non HMO)

## 2015-02-15 ENCOUNTER — Encounter (HOSPITAL_COMMUNITY): Payer: Self-pay | Admitting: Emergency Medicine

## 2015-02-15 DIAGNOSIS — F1120 Opioid dependence, uncomplicated: Secondary | ICD-10-CM | POA: Diagnosis present

## 2015-02-15 DIAGNOSIS — K50813 Crohn's disease of both small and large intestine with fistula: Secondary | ICD-10-CM | POA: Diagnosis present

## 2015-02-15 DIAGNOSIS — Z87891 Personal history of nicotine dependence: Secondary | ICD-10-CM | POA: Diagnosis not present

## 2015-02-15 DIAGNOSIS — K5010 Crohn's disease of large intestine without complications: Secondary | ICD-10-CM | POA: Diagnosis present

## 2015-02-15 DIAGNOSIS — Z6820 Body mass index (BMI) 20.0-20.9, adult: Secondary | ICD-10-CM

## 2015-02-15 DIAGNOSIS — B9689 Other specified bacterial agents as the cause of diseases classified elsewhere: Secondary | ICD-10-CM

## 2015-02-15 DIAGNOSIS — R109 Unspecified abdominal pain: Secondary | ICD-10-CM

## 2015-02-15 DIAGNOSIS — K50914 Crohn's disease, unspecified, with abscess: Secondary | ICD-10-CM | POA: Diagnosis not present

## 2015-02-15 DIAGNOSIS — R1032 Left lower quadrant pain: Secondary | ICD-10-CM | POA: Diagnosis present

## 2015-02-15 DIAGNOSIS — G8929 Other chronic pain: Secondary | ICD-10-CM | POA: Diagnosis present

## 2015-02-15 DIAGNOSIS — K50114 Crohn's disease of large intestine with abscess: Secondary | ICD-10-CM

## 2015-02-15 DIAGNOSIS — K651 Peritoneal abscess: Secondary | ICD-10-CM

## 2015-02-15 DIAGNOSIS — K56600 Partial intestinal obstruction, unspecified as to cause: Secondary | ICD-10-CM | POA: Diagnosis present

## 2015-02-15 DIAGNOSIS — K50814 Crohn's disease of both small and large intestine with abscess: Principal | ICD-10-CM | POA: Diagnosis present

## 2015-02-15 DIAGNOSIS — D509 Iron deficiency anemia, unspecified: Secondary | ICD-10-CM | POA: Diagnosis present

## 2015-02-15 DIAGNOSIS — Z7952 Long term (current) use of systemic steroids: Secondary | ICD-10-CM

## 2015-02-15 DIAGNOSIS — E440 Moderate protein-calorie malnutrition: Secondary | ICD-10-CM | POA: Diagnosis present

## 2015-02-15 DIAGNOSIS — IMO0002 Reserved for concepts with insufficient information to code with codable children: Secondary | ICD-10-CM

## 2015-02-15 DIAGNOSIS — Z8639 Personal history of other endocrine, nutritional and metabolic disease: Secondary | ICD-10-CM

## 2015-02-15 DIAGNOSIS — K50812 Crohn's disease of both small and large intestine with intestinal obstruction: Secondary | ICD-10-CM | POA: Diagnosis not present

## 2015-02-15 DIAGNOSIS — D638 Anemia in other chronic diseases classified elsewhere: Secondary | ICD-10-CM | POA: Diagnosis present

## 2015-02-15 LAB — CBC
HCT: 25.3 % — ABNORMAL LOW (ref 36.0–46.0)
Hemoglobin: 7.3 g/dL — ABNORMAL LOW (ref 12.0–15.0)
MCH: 21 pg — ABNORMAL LOW (ref 26.0–34.0)
MCHC: 28.9 g/dL — ABNORMAL LOW (ref 30.0–36.0)
MCV: 72.9 fL — ABNORMAL LOW (ref 78.0–100.0)
Platelets: 387 10*3/uL (ref 150–400)
RBC: 3.47 MIL/uL — AB (ref 3.87–5.11)
RDW: 16.9 % — ABNORMAL HIGH (ref 11.5–15.5)
WBC: 25.1 10*3/uL — ABNORMAL HIGH (ref 4.0–10.5)

## 2015-02-15 LAB — CBC WITH DIFFERENTIAL/PLATELET
BASOS PCT: 0 % (ref 0–1)
Basophils Absolute: 0 10*3/uL (ref 0.0–0.1)
EOS PCT: 0 % (ref 0–5)
Eosinophils Absolute: 0 10*3/uL (ref 0.0–0.7)
HEMATOCRIT: 26.3 % — AB (ref 36.0–46.0)
Hemoglobin: 7.7 g/dL — ABNORMAL LOW (ref 12.0–15.0)
LYMPHS ABS: 2.6 10*3/uL (ref 0.7–4.0)
Lymphocytes Relative: 8 % — ABNORMAL LOW (ref 12–46)
MCH: 21 pg — AB (ref 26.0–34.0)
MCHC: 29.3 g/dL — ABNORMAL LOW (ref 30.0–36.0)
MCV: 71.9 fL — AB (ref 78.0–100.0)
MONO ABS: 2.6 10*3/uL — AB (ref 0.1–1.0)
MONOS PCT: 8 % (ref 3–12)
NEUTROS ABS: 27.7 10*3/uL — AB (ref 1.7–7.7)
Neutrophils Relative %: 84 % — ABNORMAL HIGH (ref 43–77)
PLATELETS: 353 10*3/uL (ref 150–400)
RBC: 3.66 MIL/uL — AB (ref 3.87–5.11)
RDW: 16.8 % — AB (ref 11.5–15.5)
WBC: 32.9 10*3/uL — AB (ref 4.0–10.5)

## 2015-02-15 LAB — URINALYSIS, ROUTINE W REFLEX MICROSCOPIC
Bilirubin Urine: NEGATIVE
Glucose, UA: NEGATIVE mg/dL
Hgb urine dipstick: NEGATIVE
KETONES UR: NEGATIVE mg/dL
LEUKOCYTES UA: NEGATIVE
Nitrite: NEGATIVE
Protein, ur: NEGATIVE mg/dL
Specific Gravity, Urine: 1.019 (ref 1.005–1.030)
Urobilinogen, UA: 0.2 mg/dL (ref 0.0–1.0)
pH: 5.5 (ref 5.0–8.0)

## 2015-02-15 LAB — I-STAT CG4 LACTIC ACID, ED
Lactic Acid, Venous: 1.33 mmol/L (ref 0.5–2.0)
Lactic Acid, Venous: 1.78 mmol/L (ref 0.5–2.0)

## 2015-02-15 LAB — COMPREHENSIVE METABOLIC PANEL
ALK PHOS: 57 U/L (ref 38–126)
ALT: 10 U/L — ABNORMAL LOW (ref 14–54)
AST: 15 U/L (ref 15–41)
Albumin: 2.8 g/dL — ABNORMAL LOW (ref 3.5–5.0)
Anion gap: 10 (ref 5–15)
BUN: 11 mg/dL (ref 6–20)
CHLORIDE: 104 mmol/L (ref 101–111)
CO2: 25 mmol/L (ref 22–32)
Calcium: 8.8 mg/dL — ABNORMAL LOW (ref 8.9–10.3)
Creatinine, Ser: 0.8 mg/dL (ref 0.44–1.00)
Glucose, Bld: 136 mg/dL — ABNORMAL HIGH (ref 65–99)
Potassium: 4.1 mmol/L (ref 3.5–5.1)
Sodium: 139 mmol/L (ref 135–145)
TOTAL PROTEIN: 6.3 g/dL — AB (ref 6.5–8.1)
Total Bilirubin: 0.3 mg/dL (ref 0.3–1.2)

## 2015-02-15 LAB — PROTIME-INR
INR: 1.15 (ref 0.00–1.49)
PROTHROMBIN TIME: 14.9 s (ref 11.6–15.2)

## 2015-02-15 LAB — PHOSPHORUS: Phosphorus: 3.2 mg/dL (ref 2.5–4.6)

## 2015-02-15 LAB — APTT: aPTT: 26 seconds (ref 24–37)

## 2015-02-15 LAB — RETICULOCYTES
RBC.: 3.66 MIL/uL — ABNORMAL LOW (ref 3.87–5.11)
RETIC CT PCT: 2.7 % (ref 0.4–3.1)
Retic Count, Absolute: 98.8 10*3/uL (ref 19.0–186.0)

## 2015-02-15 LAB — FERRITIN: FERRITIN: 7 ng/mL — AB (ref 11–307)

## 2015-02-15 LAB — IRON AND TIBC
IRON: 5 ug/dL — AB (ref 28–170)
SATURATION RATIOS: 1 % — AB (ref 10.4–31.8)
TIBC: 360 ug/dL (ref 250–450)
UIBC: 355 ug/dL

## 2015-02-15 LAB — MAGNESIUM: MAGNESIUM: 1.6 mg/dL — AB (ref 1.7–2.4)

## 2015-02-15 LAB — LIPASE, BLOOD: LIPASE: 15 U/L — AB (ref 22–51)

## 2015-02-15 LAB — ABO/RH: ABO/RH(D): A POS

## 2015-02-15 LAB — HIV ANTIBODY (ROUTINE TESTING W REFLEX): HIV SCREEN 4TH GENERATION: NONREACTIVE

## 2015-02-15 LAB — PREALBUMIN: Prealbumin: 20.5 mg/dL (ref 18–38)

## 2015-02-15 MED ORDER — HYDROMORPHONE HCL 1 MG/ML IJ SOLN
2.0000 mg | INTRAMUSCULAR | Status: DC | PRN
  Administered 2015-02-15 – 2015-02-16 (×6): 2 mg via INTRAVENOUS
  Filled 2015-02-15 (×6): qty 2

## 2015-02-15 MED ORDER — FENTANYL CITRATE (PF) 100 MCG/2ML IJ SOLN
INTRAMUSCULAR | Status: AC | PRN
  Administered 2015-02-15 (×3): 50 ug via INTRAVENOUS

## 2015-02-15 MED ORDER — METRONIDAZOLE IN NACL 5-0.79 MG/ML-% IV SOLN
500.0000 mg | Freq: Once | INTRAVENOUS | Status: DC
  Filled 2015-02-15: qty 100

## 2015-02-15 MED ORDER — CIPROFLOXACIN IN D5W 400 MG/200ML IV SOLN: 400.0000 mg | Freq: Two times a day (BID) | INTRAVENOUS | Status: DC

## 2015-02-15 MED ORDER — HYDROMORPHONE HCL 1 MG/ML IJ SOLN: 1.0000 mg | INTRAMUSCULAR | Status: DC | PRN

## 2015-02-15 MED ORDER — LACTATED RINGERS IV SOLN
INTRAVENOUS | Status: DC
  Administered 2015-02-15: 18:00:00 via INTRAVENOUS
  Administered 2015-02-16 – 2015-02-17 (×2): 1 via INTRAVENOUS
  Administered 2015-02-18: 10:00:00 via INTRAVENOUS

## 2015-02-15 MED ORDER — MIDAZOLAM HCL 2 MG/2ML IJ SOLN
INTRAMUSCULAR | Status: AC | PRN
  Administered 2015-02-15 (×2): 1 mg via INTRAVENOUS

## 2015-02-15 MED ORDER — METHYLPREDNISOLONE SODIUM SUCC 125 MG IJ SOLR
60.0000 mg | Freq: Four times a day (QID) | INTRAMUSCULAR | Status: DC
  Administered 2015-02-15 – 2015-02-16 (×3): 60 mg via INTRAVENOUS
  Filled 2015-02-15 (×3): qty 2

## 2015-02-15 MED ORDER — MAGNESIUM SULFATE 2 GM/50ML IV SOLN
2.0000 g | Freq: Once | INTRAVENOUS | Status: AC
  Administered 2015-02-15: 2 g via INTRAVENOUS
  Filled 2015-02-15: qty 50

## 2015-02-15 MED ORDER — PROMETHAZINE HCL 25 MG/ML IJ SOLN
12.5000 mg | Freq: Once | INTRAMUSCULAR | Status: AC
  Administered 2015-02-15: 12.5 mg via INTRAVENOUS
  Filled 2015-02-15: qty 1

## 2015-02-15 MED ORDER — PROMETHAZINE HCL 25 MG/ML IJ SOLN
12.5000 mg | INTRAMUSCULAR | Status: DC | PRN
  Administered 2015-02-15 (×2): 12.5 mg via INTRAVENOUS
  Filled 2015-02-15 (×2): qty 1

## 2015-02-15 MED ORDER — ONDANSETRON HCL 4 MG/2ML IJ SOLN: 4.0000 mg | INTRAMUSCULAR | Status: DC

## 2015-02-15 MED ORDER — LIDOCAINE HCL 1 % IJ SOLN
INTRAMUSCULAR | Status: AC
Start: 2015-02-15 — End: 2015-02-16
  Filled 2015-02-15: qty 20

## 2015-02-15 MED ORDER — PIPERACILLIN-TAZOBACTAM 3.375 G IVPB
3.3750 g | Freq: Three times a day (TID) | INTRAVENOUS | Status: DC
  Administered 2015-02-15 – 2015-02-19 (×11): 3.375 g via INTRAVENOUS
  Filled 2015-02-15 (×16): qty 50

## 2015-02-15 MED ORDER — IOHEXOL 300 MG/ML  SOLN
80.0000 mL | Freq: Once | INTRAMUSCULAR | Status: AC | PRN
  Administered 2015-02-15: 80 mL via INTRAVENOUS

## 2015-02-15 MED ORDER — ACETAMINOPHEN 650 MG RE SUPP
650.0000 mg | Freq: Four times a day (QID) | RECTAL | Status: DC | PRN
  Administered 2015-02-15: 650 mg via RECTAL
  Filled 2015-02-15: qty 1

## 2015-02-15 MED ORDER — CETYLPYRIDINIUM CHLORIDE 0.05 % MT LIQD
7.0000 mL | Freq: Two times a day (BID) | OROMUCOSAL | Status: DC
  Administered 2015-02-15 – 2015-02-20 (×10): 7 mL via OROMUCOSAL

## 2015-02-15 MED ORDER — HYDROMORPHONE HCL 1 MG/ML IJ SOLN
1.0000 mg | Freq: Once | INTRAMUSCULAR | Status: AC
  Administered 2015-02-15: 1 mg via INTRAVENOUS
  Filled 2015-02-15: qty 1

## 2015-02-15 MED ORDER — MIDAZOLAM HCL 2 MG/2ML IJ SOLN
INTRAMUSCULAR | Status: AC
Start: 2015-02-15 — End: 2015-02-16
  Filled 2015-02-15: qty 6

## 2015-02-15 MED ORDER — FENTANYL CITRATE (PF) 100 MCG/2ML IJ SOLN
INTRAMUSCULAR | Status: AC
  Filled 2015-02-15: qty 4

## 2015-02-15 MED ORDER — CIPROFLOXACIN IN D5W 400 MG/200ML IV SOLN
400.0000 mg | Freq: Once | INTRAVENOUS | Status: AC
  Administered 2015-02-15: 400 mg via INTRAVENOUS
  Filled 2015-02-15: qty 200

## 2015-02-15 NOTE — H&P (Signed)
Date: 02/15/2015               Patient Name:  Krystal [name] MRN: [id number]  DOB: April 17, 1961 Age / Sex: 54 y.o., female   PCP: No Pcp Per Patient         Medical Service: Internal Medicine Teaching Service         Attending Physician: Dr. Oval Linsey, MD    First Contact: Dr. Charlynn Grimes Pager: [phone]  Second Contact: Dr. Naaman Plummer Pager: [phone]       After Hours (After 5p/  First Contact Pager: [phone]  weekends / holidays): Second Contact Pager: [phone]   Chief Complaint: Abdominal Pain  History of Present Illness: Mrs. Krystal [name] is a 54 year old caucasian female with a PMH of Crohn's Disease and SBO presents to [facility] ED today with 1 day history of abdominal pain. Patient reports she was in her usual state of health until around 11 PM last night she experienced sudden onset LLQ abdominal pain. She describes the pain as sharp and stabbing, >10/10. Pain is worse with any movement. Does endorse nausea but denies any emesis. Last BM was yesterday afternoon. She is on chronic Miralax with daily watery BMs. Reports subjective fever and chills that started this morning. Has chronic abdominal bloating. Denies any blood in her stool or melena. Denies any lightheadedness, dizziness, syncope, chest pain or dysuria.   She was admitted in June 2016 with a partial SBO. She was medically managed and discharged with a steroid taper. She is followed by Dr. Cristina [name] (GI) for her Crohn's and was kept on 10 mg prednisone chronically following June admission. No other medications for her Crohn's. She is on Methadone due to chronic severe abdominal pain due to her flare ups. She was scheduled to see Dr. Alphonsa [name] later this month for possible elective small bowel resection. She has quit smoking since her last admission, was previously smoking 2 PPD.   CT Abdomen done in the ED with multiloculated pelvic and right flank abscesses as well and intermittent areas of small bowel distention compatible with partial  obstruction. Received Cipro and Flagyl in the ED.   Evaluated by surgery and IR. Held NPO, NG drainage, stopped Cipro and Flagyl and started on Zosyn. Will go for CT guided drainage later today.   Meds: Current Facility-Administered Medications  Medication Dose Route Frequency Provider Last Rate Last Dose  . acetaminophen (TYLENOL) suppository 650 mg  650 mg Rectal Q6H PRN Marjan Rabbani, MD      . HYDROmorphone (DILAUDID) injection 2 mg  2 mg Intravenous Q4H PRN Juluis Mire, MD   2 mg at 02/15/15 0950  . lactated ringers infusion   Intravenous Continuous Marjan Rabbani, MD      . magnesium sulfate IVPB 2 g 50 mL  2 g Intravenous Once Marjan Rabbani, MD 50 mL/hr at 02/15/15 1140 2 g at 02/15/15 1140  . piperacillin-tazobactam (ZOSYN) IVPB 3.375 g  3.375 g Intravenous 3 times per day Roma Schanz, [facility]      . promethazine (PHENERGAN) injection 12.5 mg  12.5 mg Intravenous Q4H PRN Marjan Rabbani, MD   12.5 mg at 02/15/15 1040   Current Outpatient Prescriptions  Medication Sig Dispense Refill  . methadone (DOLOPHINE) 10 MG/ML solution Take 4 mLs (40 mg total) by mouth daily. (Patient taking differently: Take 43 mg by mouth daily. )  0  . polyethylene glycol (MIRALAX / GLYCOLAX) packet Take 17 g by mouth daily.    . predniSONE (DELTASONE)  10 MG tablet Take 10 mg by mouth daily with breakfast.    . feeding supplement, RESOURCE BREEZE, (RESOURCE BREEZE) LIQD Take 1 Container by mouth 3 (three) times daily between meals. (Patient not taking: Reported on 02/15/2015) 30 Container 0  . predniSONE (DELTASONE) 20 MG tablet 3 tabs po daily x 5 days, then 2 tabs x 5 days, then 1.5 tabs x 5 days, then 1 tab x 5 days, then 0.5 tabs x 5 days (Patient not taking: Reported on 02/15/2015) 40 tablet 0    Allergies: Allergies as of 02/15/2015 - Review Complete 02/15/2015  Allergen Reaction Noted  . Sulfa antibiotics Rash 11/13/2014   Past Medical History  Diagnosis Date  . SBO (small bowel obstruction)  11/2014  . Crohn's colitis    Past Surgical History  Procedure Laterality Date  . Abdominal hysterectomy    . Appendectomy    . Tonsillectomy     History reviewed. No pertinent family history. Social History   Social History  . Marital Status: Single    Spouse Name: N/A  . Number of Children: N/A  . Years of Education: N/A   Occupational History  . Not on file.   Social History Main Topics  . Smoking status: Former Smoker    Quit date: 11/12/2014  . Smokeless tobacco: Never Used  . Alcohol Use: No  . Drug Use: No  . Sexual Activity: Not on file   Other Topics Concern  . Not on file   Social History Narrative    Review of Systems: Review of Systems  Constitutional: Positive for fever and chills. Negative for weight loss.  Eyes: Negative for blurred vision.  Respiratory: Negative for cough and shortness of breath.   Cardiovascular: Negative for chest pain and palpitations.  Gastrointestinal: Positive for nausea and abdominal pain. Negative for vomiting, diarrhea, constipation, blood in stool and melena.  Genitourinary: Negative for dysuria, frequency and hematuria.  Musculoskeletal: Negative for myalgias.  Skin: Negative for rash.  Neurological: Negative for dizziness and headaches.   Physical Exam: Blood pressure 115/59, pulse 97, temperature 102.4 F (39.1 C), temperature source Oral, resp. rate 20, weight 104 lb 7 oz (47.373 kg), SpO2 100 %.   Physical Exam GENERAL- alert, co-operative, appears as stated age, in distress HEENT- Atraumatic, normocephalic, PERRL, EOMI, oral mucosa appears moist, good and intact dentition. No carotid bruit, no cervical LN enlargement, thyroid does not appear enlarged, neck supple. CARDIAC- RRR, no murmurs, rubs or gallops. RESP- Moving equal volumes of air, and clear to auscultation bilaterally, no wheezes or crackles. ABDOMEN- Patient with involuntary guarding. Pain with stethoscope placed on abdomen. Hypoactive bowel sounds.  Exam limited due to pain.  NEURO- No obvious Cr N abnormality, strenght upper and lower extremities- 5/5, Sensation intact- globally EXTREMITIES- pulse 2+, symmetric, no pedal edema. SKIN- Warm, dry, No rash or lesion. PSYCH- Normal mood and affect, appropriate thought content and speech.  Lab results: Basic Metabolic Panel:  Recent Labs  02/15/15 0315 02/15/15 0925  NA 139  --   K 4.1  --   CL 104  --   CO2 25  --   GLUCOSE 136*  --   BUN 11  --   CREATININE 0.80  --   CALCIUM 8.8*  --   MG  --  1.6*  PHOS  --  3.2   Liver Function Tests:  Recent Labs  02/15/15 0315  AST 15  ALT 10*  ALKPHOS 57  BILITOT 0.3  PROT 6.3*  ALBUMIN  2.8*    Recent Labs  02/15/15 0315  LIPASE 15*   No results for input(s): AMMONIA in the last 72 hours. CBC:  Recent Labs  02/15/15 0315 02/15/15 0925  WBC 25.1* 32.9*  NEUTROABS  --  27.7*  HGB 7.3* 7.7*  HCT 25.3* 26.3*  MCV 72.9* 71.9*  PLT 387 353   Urine Drug Screen: Drugs of Abuse     Component Value Date/Time   LABOPIA RESULTS UNAVAILABLE DUE TO INTERFERING SUBSTANCE* 11/14/2014 0853   COCAINSCRNUR NONE DETECTED 11/14/2014 0853   LABBENZ NONE DETECTED 11/14/2014 0853   AMPHETMU RESULTS UNAVAILABLE DUE TO INTERFERING SUBSTANCE* 11/14/2014 0853   THCU RESULTS UNAVAILABLE DUE TO INTERFERING SUBSTANCE* 11/14/2014 0853   LABBARB NONE DETECTED 11/14/2014 0853    Alcohol Level: No results for input(s): ETH in the last 72 hours. Urinalysis:  Recent Labs  02/15/15 0409  COLORURINE YELLOW  LABSPEC 1.019  PHURINE 5.5  GLUCOSEU NEGATIVE  HGBUR NEGATIVE  BILIRUBINUR NEGATIVE  KETONESUR NEGATIVE  PROTEINUR NEGATIVE  UROBILINOGEN 0.2  NITRITE NEGATIVE  LEUKOCYTESUR NEGATIVE    Imaging results:  Ct Abdomen Pelvis W Contrast  02/15/2015   CLINICAL DATA:  Abdominal pain, left lower quadrant.  EXAM: CT ABDOMEN AND PELVIS WITH CONTRAST  TECHNIQUE: Multidetector CT imaging of the abdomen and pelvis was performed using  the standard protocol following bolus administration of intravenous contrast.  CONTRAST:  54mL OMNIPAQUE IOHEXOL 300 MG/ML  SOLN  COMPARISON:  11/13/2014  FINDINGS: Lower chest and abdominal wall:  No contributory findings.  Hepatobiliary: No focal liver abnormality.Chronic dilation of the common bile duct to 8 mm. No visible calcified choledocholithiasis. Liver function tests are pending.  Pancreas: Stable mild prominence of the main pancreatic duct.  Spleen: Unremarkable.  Adrenals/Urinary Tract: Negative adrenals. No hydronephrosis or stone. Too small to characterize low-density in the lower pole left kidney, stable and statistically a cyst. Unremarkable bladder.  Reproductive:Hysterectomy and possible oophorectomies.  Stomach/Bowel: Multi focal skip areas of bowel inflammation with wall thickening from submucosal edema and hypervascular mucosa. Findings consistent with Crohn's disease. 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 These are marked in the right abdomen on image 55 series 2. There are 5 distinct fluid collections with rim enhancement, some with internal gas, consistent with abscess:  1. Isolated right pericolic gutter at 15 mm maximal diameter. 2. Rectovaginal recess at 42 mm diameter. 3. Posterior to the bladder is a flat collection measuring 38 mm in width by 12 mm in thickness. 4. Ventral to this collection is a discrete appearing bilobed collection with small communicating channel. The upper collection measures 53 mm, the lower 40 mm. 5. Ventral to this collection is a discrete appearing collection which measures 49 x 14 mm axial dimension. 6. 7. Intermittent areas of small bowel distention compatible with partial obstruction.  Vascular/Lymphatic: No acute vascular abnormality. Reactive mesenteric adenopathy.  Musculoskeletal: No acute abnormalities.  IMPRESSION: 1. Crohn's with progressive penetrating disease and multiloculated pelvic and  right flank abscess, as described above. 2. Chronically dilated common bile duct. Correlate with pending liver function tests.   Electronically Signed   By: Monte Fantasia M.D.   On: 02/15/2015 07:25   Assessment & Plan by Problem: Principal Problem:   Intra-abdominal abscess Active Problems:   Crohn's colitis   Long-term current use of methadone for opiate dependence   Partial small bowel obstruction   Malnutrition of moderate degree   Microcytic anemia   Intraabdominal abscesses  with partial SBO 2/2 Crohn's Disease: Patient is only on chronic Prednisone 10 mg for the past 3 months for her Crohn's Disease. Was admitted in June for partial SBO with medical management. Having acute onset LLQ sharp and stabbing 10/10 abdominal pain. Had regular BMs with last one yesterday. Has not had a BM since the onset of the pain. Nausea without emesis. Was initially afebrile on arrival but has since spiked a fever to 102.4. WBC 32.9. Lactate 1.33, 1.78 on repeat. CT with multiple intraabdominal absesses. Evaluated by surgery and IR. Will go for CT guided drainage of the abscesses.  - LR at 125 mL/hr - Continue Zosyn per pharmacy - Phenergan 12.5 mg IV q4hr prn - Dilaudid 2 mg IV q4hr prn - Blood cultures drawn (after Cipro given in ED) - Ttylenol 650 mg rectal q6hr prn fever - GI consult - Appreciate Surgery Consult - Appreicate IR Consult, will go for CT guided drainage - CBC in AM - BMET in AM - Phos 3.2 - Mag 1.6, replaced - Lipase 15 - Will start Solumedrol following procedure  Moderate Malnutrition: Albumin 2.8 on admission - Prealbumin 20.5   History of elevated glucose: Elevated BGs on previous admission in June with ranging from 107-273. No history of DM. - Check A1c  Methadone Dependence: Patient on Methadone 43 mg PO daily for chronic abdominal pain 2/2 Crohn's Disease. Will use Dilaudid to manage her pain as inpatient and transition back to Methadone upon discharge.  - Dilaudid 2  gm IV q4hr prn  Microcytic anemia: Fe 5, TIBC 360, % Sat 1%, Ferritin 7 - Will hold off on Fe supplementation in the setting of acute infection - Consider Fe replacement at discharge - Type and Cross  FEN: NPO with NGT for gastric decompression  DVT PPx: SCD  Dispo: Disposition is deferred at this time, awaiting improvement of current medical problems. Anticipated discharge in approximately 1-3 day(s).   The patient does not have a current PCP (No Pcp Per Patient) and does need an [facility] hospital follow-up appointment after discharge.  The patient does not have transportation limitations that hinder transportation to clinic appointments.  Signed: Maryellen Pile, MD 02/15/2015, 12:15 PM

## 2015-02-15 NOTE — H&P (Signed)
Chief Complaint: Patient was seen in consultation today for  Chief Complaint  Patient presents with  . Abdominal Pain   at the request of Dr. Donne Hazel  Referring Physician(s): * No referring provider recorded for this case *  History of Present Illness: Krystal [name] is a 54 y.o. female with known h/o Crohn's who has been having abdominal pain since last night around 11:30.  She states the pain is 10/10.  She takes Methadone due to chronic severe pain due to her flare ups. She reports chronic constipation and takes Miralax daily to assist with BMs   She does report fever and chills. She c/o nausea but has not vomited.  She ate cereal around 9:30 last night and has not eaten since.  She states her abdomen has been bloated "for a few days it seems"  She was admitted 3 months ago for acute Crohn's flare.  She takes prednisone for this and states she has not taken her medications today.  We are asked to evaluate patient for CT guided drainage/drain placement for pelvic and right flank abscesses.  Past Medical History  Diagnosis Date  . SBO (small bowel obstruction) 11/2014  . Crohn's colitis     Past Surgical History  Procedure Laterality Date  . Abdominal hysterectomy    . Appendectomy    . Tonsillectomy      Allergies: Sulfa antibiotics  Medications: Prior to Admission medications   Medication Sig Start Date End Date Taking? Authorizing Provider  methadone (DOLOPHINE) 10 MG/ML solution Take 4 mLs (40 mg total) by mouth daily. Patient taking differently: Take 43 mg by mouth daily.  11/22/14  Yes Jones Bales, MD  polyethylene glycol (MIRALAX / GLYCOLAX) packet Take 17 g by mouth daily.   Yes Historical Provider, MD  predniSONE (DELTASONE) 10 MG tablet Take 10 mg by mouth daily with breakfast.   Yes Historical Provider, MD  feeding supplement, RESOURCE BREEZE, (RESOURCE BREEZE) LIQD Take 1 Container by mouth 3 (three) times daily between meals. Patient not  taking: Reported on 02/15/2015 11/23/14   Riccardo Dubin, MD  predniSONE (DELTASONE) 20 MG tablet 3 tabs po daily x 5 days, then 2 tabs x 5 days, then 1.5 tabs x 5 days, then 1 tab x 5 days, then 0.5 tabs x 5 days Patient not taking: Reported on 02/15/2015 11/23/14   Riccardo Dubin, MD     History reviewed. No pertinent family history.  Social History   Social History  . Marital Status: Single    Spouse Name: N/A  . Number of Children: N/A  . Years of Education: N/A   Social History Main Topics  . Smoking status: Former Smoker    Quit date: 11/12/2014  . Smokeless tobacco: Never Used  . Alcohol Use: No  . Drug Use: No  . Sexual Activity: Not Asked   Other Topics Concern  . None   Social History Narrative     Review of Systems: A 12 point ROS discussed and pertinent positives are indicated in the HPI above.  All other systems are negative.  Review of Systems  Constitutional: Positive for fever, chills, activity change, appetite change and fatigue.  HENT: Negative.   Respiratory: Negative for cough, chest tightness and shortness of breath.   Cardiovascular: Negative for chest pain.  Gastrointestinal: Positive for nausea, abdominal pain, constipation and abdominal distention. Negative for vomiting.  Genitourinary: Negative.   Musculoskeletal: Negative.   Skin: Negative.   Neurological: Negative.  Psychiatric/Behavioral: Negative.     Vital Signs: BP 119/58 mmHg  Pulse 97  Temp(Src) 100.2 F (37.9 C) (Oral)  Resp 20  Wt 104 lb 7 oz (47.373 kg)  SpO2 100%  Physical Exam  Constitutional: She is oriented to person, place, and time. She appears well-developed and well-nourished.  HENT:  Head: Normocephalic and atraumatic.  Eyes: EOM are normal.  Neck: Normal range of motion. Neck supple.  Cardiovascular: Normal rate, regular rhythm and normal heart sounds.   Pulmonary/Chest: Effort normal and breath sounds normal.  Abdominal: She exhibits distension. There is  tenderness.  Distended and very tender in the right  Musculoskeletal: Normal range of motion.  Neurological: She is alert and oriented to person, place, and time.  Skin: Skin is warm and dry.  Psychiatric: She has a normal mood and affect. Her behavior is normal. Judgment and thought content normal.  Vitals reviewed.   Mallampati Score:  MD Evaluation Airway: WNL Heart: WNL Abdomen: WNL Chest/ Lungs: WNL ASA  Classification: 2 Mallampati/Airway Score: Two  Imaging: Ct Abdomen Pelvis W Contrast  02/15/2015   CLINICAL DATA:  Abdominal pain, left lower quadrant.  EXAM: CT ABDOMEN AND PELVIS WITH CONTRAST  TECHNIQUE: Multidetector CT imaging of the abdomen and pelvis was performed using the standard protocol following bolus administration of intravenous contrast.  CONTRAST:  70mL OMNIPAQUE IOHEXOL 300 MG/ML  SOLN  COMPARISON:  11/13/2014  FINDINGS: Lower chest and abdominal wall:  No contributory findings.  Hepatobiliary: No focal liver abnormality.Chronic dilation of the common bile duct to 8 mm. No visible calcified choledocholithiasis. Liver function tests are pending.  Pancreas: Stable mild prominence of the main pancreatic duct.  Spleen: Unremarkable.  Adrenals/Urinary Tract: Negative adrenals. No hydronephrosis or stone. Too small to characterize low-density in the lower pole left kidney, stable and statistically a cyst. Unremarkable bladder.  Reproductive:Hysterectomy and possible oophorectomies.  Stomach/Bowel: Multi focal skip areas of bowel inflammation with wall thickening from submucosal edema and hypervascular mucosa. Findings consistent with Crohn's disease. 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 These are marked in the right abdomen on image 55 series 2. There are 5 distinct fluid collections with rim enhancement, some with internal gas, consistent with abscess:  1. Isolated right pericolic gutter at 15 mm maximal diameter. 2.  Rectovaginal recess at 42 mm diameter. 3. Posterior to the bladder is a flat collection measuring 38 mm in width by 12 mm in thickness. 4. Ventral to this collection is a discrete appearing bilobed collection with small communicating channel. The upper collection measures 53 mm, the lower 40 mm. 5. Ventral to this collection is a discrete appearing collection which measures 49 x 14 mm axial dimension. 6. 7. Intermittent areas of small bowel distention compatible with partial obstruction.  Vascular/Lymphatic: No acute vascular abnormality. Reactive mesenteric adenopathy.  Musculoskeletal: No acute abnormalities.  IMPRESSION: 1. Crohn's with progressive penetrating disease and multiloculated pelvic and right flank abscess, as described above. 2. Chronically dilated common bile duct. Correlate with pending liver function tests.   Electronically Signed   By: Monte Fantasia M.D.   On: 02/15/2015 07:25    Labs:  CBC:  Recent Labs  11/14/14 0322 11/17/14 0330 02/15/15 0315 02/15/15 0925  WBC 18.3* 21.0* 25.1* 32.9*  HGB 10.1* 10.3* 7.3* 7.7*  HCT 30.0* 30.8* 25.3* 26.3*  PLT 422* 446* 387 353    COAGS:  Recent Labs  02/15/15 0925  INR 1.15  APTT 26  BMP:  Recent Labs  11/15/14 0443 11/16/14 0417 11/21/14 0536 02/15/15 0315  NA 145 143 135 139  K 4.1 4.3 4.1 4.1  CL 112* 109 97* 104  CO2 22 25 29 25   GLUCOSE 107* 144* 129* 136*  BUN 17 23* 14 11  CALCIUM 8.6* 8.8* 7.9* 8.8*  CREATININE 0.80 0.75 0.73 0.80  GFRNONAA >60 >60 >60 >60  GFRAA >60 >60 >60 >60    LIVER FUNCTION TESTS:  Recent Labs  11/13/14 0258 11/14/14 0322 02/15/15 0315  BILITOT 0.5 0.4 0.3  AST 20 20 15   ALT 17 19 10*  ALKPHOS 124 73 57  PROT 6.5 5.4* 6.3*  ALBUMIN 2.7* 1.9* 2.8*    TUMOR MARKERS: No results for input(s): AFPTM, CEA, CA199, CHROMGRNA in the last 8760 hours.  Assessment and Plan:  Crohn's colitis with right pelvic abscesses  Will proceed with CT guided drainage/drain  placement today by Dr. Earleen Newport  Risks and Benefits discussed with the patient including bleeding, infection, damage to adjacent structures, bowel perforation/fistula connection, and sepsis.  All of the patient's questions were answered, patient is agreeable to proceed. Consent signed and in chart.  Thank you for this interesting consult.  I greatly enjoyed meeting Ella A Moffitt and look forward to participating in their care.  A copy of this report was sent to the requesting provider on this date.  SignedMurrell Redden 02/15/2015, 11:15 AM   I spent a total of 40 Minutes  in face to face in clinical consultation, greater than 50% of which was counseling/coordinating care for CT guided drain placement

## 2015-02-15 NOTE — Progress Notes (Signed)
Order for NG tube. Patient requested nurse to wait until pain meds were due. Now Patient sent to radiology. Will insert NG tube when patient returns.

## 2015-02-15 NOTE — ED Notes (Signed)
Patient transported to CT 

## 2015-02-15 NOTE — ED Notes (Addendum)
IR PA, [place], in [place] - advised of pt's temp 102.4 and that pt has bed. Requested if pt may remain in ED until procedure - has 1 pt ahead of her. Advised no d/t other admissions holding.

## 2015-02-15 NOTE — Progress Notes (Signed)
ANTIBIOTIC CONSULT NOTE - INITIAL  Pharmacy Consult for Zosyn Indication: abscesses  Allergies  Allergen Reactions  . Sulfa Antibiotics Rash    Patient Measurements: Weight: 104 lb 7 oz (47.373 kg)  Vital Signs: Temp: 102.4 F (39.1 C) (09/10 1134) Temp Source: Oral (09/10 1053) BP: 115/59 mmHg (09/10 1130) Pulse Rate: 97 (09/10 1053) Intake/Output from previous day:   Intake/Output from this shift:    Labs:  Recent Labs  02/15/15 0315 02/15/15 0925  WBC 25.1* 32.9*  HGB 7.3* 7.7*  PLT 387 353  CREATININE 0.80  --    Estimated Creatinine Clearance: 61.6 mL/min (by C-G formula based on Cr of 0.8). No results for input(s): VANCOTROUGH, VANCOPEAK, VANCORANDOM, GENTTROUGH, GENTPEAK, GENTRANDOM, TOBRATROUGH, TOBRAPEAK, TOBRARND, AMIKACINPEAK, AMIKACINTROU, AMIKACIN in the last 72 hours.   Microbiology: Recent Results (from the past 720 hour(s))  Culture, blood (routine x 2)     Status: None (Preliminary result)   Collection Time: 02/15/15  9:40 AM  Result Value Ref Range Status   Specimen Description BLOOD LEFT HAND  Final   Special Requests BOTTLES DRAWN AEROBIC ONLY 10CC  Final   Culture PENDING  Incomplete   Report Status PENDING  Incomplete    Medical History: Past Medical History  Diagnosis Date  . SBO (small bowel obstruction) 11/2014  . Crohn's colitis     Medications:  Scheduled:   Assessment: 54 YO female presents w/ LLQ pain, nausea, and abd distension. Hx of Crohn's and bowel obstruction. Pharmacy consulted for Zosyn for right pelvic abscesses.   Pt w/ Crohn's colits with right pelvic abscesses. To get drainage and drain placement of abscesses today. WBC 25.1 > 32.9. Tmax 102.4.   9/10 Blood Cx x2:  Zosyn 9/10>  Nephro: SCr at baseline 0.80, CrCl ~62 ml/min    Plan:  Start Zosyn 3.375 g q8h F/u cultures, renal function, clinical progression.  Joya San, PharmD Clinical Pharmacy Resident Pager # [phone] 02/15/2015 12:08 PM

## 2015-02-15 NOTE — Sedation Documentation (Signed)
Pt will be placed on her back now for 2nd drain placement

## 2015-02-15 NOTE — ED Notes (Signed)
C/o LLQ pain, nausea, and abd distention since 11pm.  History of Crohn's disease and bowel obstruction.  Appt later this month with surgeon to schedule surgery to have ileum "cleaned out."

## 2015-02-15 NOTE — Consult Note (Signed)
Referring Provider:  Dr. Chiquita Loth  (Internal Medicine Teaching Service) Primary Care Physician:  No PCP Per Patient Primary Gastroenterologist:  Dr. Cristina Gong   Reason for Consultation:   Crohn's disease with abscesses  HPI: Krystal [name] is a 54 y.o. female admitted through the ER today with multiple intra-abdominal abscesses in the setting of Crohn's disease.  The patient was noted to have intestinal obstruction with CT evidence of thickening of the last 20 cm of TI about 5 years ago; colonoscopy around that time showed no colitis, but stenosis of the ileo-cecal valve.  The patient was never treated for Crohn's, and in fact, was lost to follow-up for the next 5 years, during which time she managed her chronic obstructive pain symptoms with methadone obtained from other physicians.  She resurfaced in June of this year when she was admitted with an intestinal obstruction, and sent home from the hospital on prednisone.  Over the ensuing 3 months, she has successfully tapered her prednisone from 60 mg to 10 mg daily, while also successfully quitting smoking.  She has remained on methadone but has significantly reduced her dose during the same time.    She has chronic intestinal dilatation (probably due to both partial obstruction and anti-motility effects of her chronic narcotic therapy) and therefore chronically has significant abdominal distension.    She was referred to Dr. Alphonsa Overall of General Surgery but her appointment is not until next month.    Last night, she had an abrupt change in status, with the new onset of left lower quadrant pain of marked intensity, awakening her out of sleep.  She presented to the ER where CT showed multiple abscesses.  She had never previously had fistulizing or perforating disease, nor any problem with abscesses.  Surgical consultation recommended IR drainage of the abscesses as a prelude to eventual surgical management.  This has been accomplished  today.    Past Medical History  Diagnosis Date  . SBO (small bowel obstruction) 11/2014  . Crohn's colitis     Past Surgical History  Procedure Laterality Date  . Abdominal hysterectomy    . Appendectomy    . Tonsillectomy      Prior to Admission medications   Medication Sig Start Date End Date Taking? Authorizing Provider  methadone (DOLOPHINE) 10 MG/ML solution Take 4 mLs (40 mg total) by mouth daily. Patient taking differently: Take 43 mg by mouth daily.  11/22/14  Yes Jones Bales, MD  polyethylene glycol (MIRALAX / GLYCOLAX) packet Take 17 g by mouth daily.   Yes Historical Provider, MD  predniSONE (DELTASONE) 10 MG tablet Take 10 mg by mouth daily with breakfast.   Yes Historical Provider, MD  feeding supplement, RESOURCE BREEZE, (RESOURCE BREEZE) LIQD Take 1 Container by mouth 3 (three) times daily between meals. Patient not taking: Reported on 02/15/2015 11/23/14   Riccardo Dubin, MD  predniSONE (DELTASONE) 20 MG tablet 3 tabs po daily x 5 days, then 2 tabs x 5 days, then 1.5 tabs x 5 days, then 1 tab x 5 days, then 0.5 tabs x 5 days Patient not taking: Reported on 02/15/2015 11/23/14   Riccardo Dubin, MD    Current Facility-Administered Medications  Medication Dose Route Frequency Provider Last Rate Last Dose  . acetaminophen (TYLENOL) suppository 650 mg  650 mg Rectal Q6H PRN Juluis Mire, MD   650 mg at 02/15/15 1222  . antiseptic oral rinse (CPC / CETYLPYRIDINIUM CHLORIDE 0.05%) solution 7 mL  7 mL Mouth  Rinse BID Oval Linsey, MD      . fentaNYL (SUBLIMAZE) 100 MCG/2ML injection           . HYDROmorphone (DILAUDID) injection 2 mg  2 mg Intravenous Q4H PRN Juluis Mire, MD   2 mg at 02/15/15 1325  . lactated ringers infusion   Intravenous Continuous Juluis Mire, MD 125 mL/hr at 02/15/15 1819    . lidocaine (XYLOCAINE) 1 % (with pres) injection           . methylPREDNISolone sodium succinate (SOLU-MEDROL) 125 mg/2 mL injection 60 mg  60 mg Intravenous Q6H Marjan  Rabbani, MD   60 mg at 02/15/15 1715  . midazolam (VERSED) 2 MG/2ML injection           . piperacillin-tazobactam (ZOSYN) IVPB 3.375 g  3.375 g Intravenous 3 times per day Roma Schanz, [facility]      . promethazine (PHENERGAN) injection 12.5 mg  12.5 mg Intravenous Q4H PRN Marjan Rabbani, MD   12.5 mg at 02/15/15 1613    Allergies as of 02/15/2015 - Review Complete 02/15/2015  Allergen Reaction Noted  . Sulfa antibiotics Rash 11/13/2014    History reviewed. No pertinent family history.  Social History   Social History  . Marital Status: Single    Spouse Name: N/A  . Number of Children: N/A  . Years of Education: N/A   Occupational History  . Not on file.   Social History Main Topics  . Smoking status: Former Smoker    Quit date: 11/12/2014  . Smokeless tobacco: Never Used  . Alcohol Use: No  . Drug Use: No  . Sexual Activity: Not on file   Other Topics Concern  . Not on file   Social History Narrative    Review of Systems: See HPI  Physical Exam: Vital signs in last 24 hours: Temp:  [98.8 F (37.1 C)-102.4 F (39.1 C)] 99.7 F (37.6 C) (09/10 1825) Pulse Rate:  [80-133] 89 (09/10 1825) Resp:  [15-22] 18 (09/10 1825) BP: (90-123)/(42-71) 100/53 mmHg (09/10 1825) SpO2:  [92 %-100 %] 100 % (09/10 1825) Weight:  [47.373 kg (104 lb 7 oz)] 47.373 kg (104 lb 7 oz) (09/10 0306) Last BM Date: 02/14/15 General:  Somulent post-procedure Head:  Normocephalic and atraumatic. Eyes:  Sclera clear, no icterus.   Conjunctiva pink. Mouth:   No ulcerations or lesions.  Oropharynx pink & moist. Neck:   No masses or thyromegaly. Lungs:  Clear throughout to auscultation but decreased vesicular breath sounds.   No wheezes, crackles, or rhonchi. No evident respiratory distress. Heart:   Regular rate and rhythm; no murmurs, clicks, rubs,  or gallops. Abdomen:  Distended.  I believe bowel sounds are present.  No peritoneal findings such as rigidity or rebound; there is some mild  diffuse tenderness.    Msk:   Symmetrical without gross deformities. Extremities:   Without clubbing, cyanosis, or edema. Neurologic:  Somulent; no focal deficits. Skin:  Intact without significant lesions or rashes. Cervical Nodes:  No significant cervical adenopathy. Psych:   Drowsy but cooperative. Normal mood and affect.  Intake/Output from previous day:   Intake/Output this shift:    Lab Results:  Recent Labs  02/15/15 0315 02/15/15 0925  WBC 25.1* 32.9*  HGB 7.3* 7.7*  HCT 25.3* 26.3*  PLT 387 353   BMET  Recent Labs  02/15/15 0315  NA 139  K 4.1  CL 104  CO2 25  GLUCOSE 136*  BUN 11  CREATININE 0.80  CALCIUM  8.8*   LFT  Recent Labs  02/15/15 0315  PROT 6.3*  ALBUMIN 2.8*  AST 15  ALT 10*  ALKPHOS 57  BILITOT 0.3   PT/INR  Recent Labs  02/15/15 0925  LABPROT 14.9  INR 1.15    Studies/Results: Ct Abdomen Pelvis W Contrast  02/15/2015   CLINICAL DATA:  Abdominal pain, left lower quadrant.  EXAM: CT ABDOMEN AND PELVIS WITH CONTRAST  TECHNIQUE: Multidetector CT imaging of the abdomen and pelvis was performed using the standard protocol following bolus administration of intravenous contrast.  CONTRAST:  64mL OMNIPAQUE IOHEXOL 300 MG/ML  SOLN  COMPARISON:  11/13/2014  FINDINGS: Lower chest and abdominal wall:  No contributory findings.  Hepatobiliary: No focal liver abnormality.Chronic dilation of the common bile duct to 8 mm. No visible calcified choledocholithiasis. Liver function tests are pending.  Pancreas: Stable mild prominence of the main pancreatic duct.  Spleen: Unremarkable.  Adrenals/Urinary Tract: Negative adrenals. No hydronephrosis or stone. Too small to characterize low-density in the lower pole left kidney, stable and statistically a cyst. Unremarkable bladder.  Reproductive:Hysterectomy and possible oophorectomies.  Stomach/Bowel: Multi focal skip areas of bowel inflammation with wall thickening from submucosal edema and hypervascular  mucosa. Findings consistent with Crohn's disease. 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 These are marked in the right abdomen on image 55 series 2. There are 5 distinct fluid collections with rim enhancement, some with internal gas, consistent with abscess:  1. Isolated right pericolic gutter at 15 mm maximal diameter. 2. Rectovaginal recess at 42 mm diameter. 3. Posterior to the bladder is a flat collection measuring 38 mm in width by 12 mm in thickness. 4. Ventral to this collection is a discrete appearing bilobed collection with small communicating channel. The upper collection measures 53 mm, the lower 40 mm. 5. Ventral to this collection is a discrete appearing collection which measures 49 x 14 mm axial dimension. 6. 7. Intermittent areas of small bowel distention compatible with partial obstruction.  Vascular/Lymphatic: No acute vascular abnormality. Reactive mesenteric adenopathy.  Musculoskeletal: No acute abnormalities.  IMPRESSION: 1. Crohn's with progressive penetrating disease and multiloculated pelvic and right flank abscess, as described above. 2. Chronically dilated common bile duct. Correlate with pending liver function tests.   Electronically Signed   By: Monte Fantasia M.D.   On: 02/15/2015 07:25   Ct Image Guided Fluid Drain By Catheter  02/15/2015   CLINICAL DATA:  54 year old female with a history of Crohn's disease, presents with multiloculated abscess of the abdomen pelvis.  She has been referred for evaluation for drain placement.  EXAM: CT GUIDED ABSCESS DRAIN BIOPSY OF PELVIC ABSCESS AND LEFT LOWER QUADRANT ABDOMINAL ABSCESS  ANESTHESIA/SEDATION: 3.0  Mg IV Versed; 150 mcg IV Fentanyl  Total Moderate Sedation Time: 45 minutes.  PROCEDURE: The procedure risks, benefits, and alternatives were explained to the patient. Questions regarding the procedure were encouraged and answered. The patient understands and consents to the  procedure.  Patient was first position in prone position on CT gantry table and a CT of the abdomen pelvis was performed for planning purposes.  Safe angle approach was determined for right trans gluteal access to pelvic abscess, and the patient is prepped and draped in the usual sterile fashion.  The right gluteal region was prepped with chlorhexidinein a sterile fashion, and a sterile drape was applied covering the operative field. A sterile gown and sterile gloves were used for the procedure. Local anesthesia was  provided with 1% Lidocaine.  Once the patient is prepped and draped sterilely, 1% lidocaine was used for local anesthesia. A small skin incision was made with 11 blade scalpel, and a trocar needle was advanced safely into pelvic abscess using CT guidance. We confirmed needle tip position with aspiration of purulent material and was CT imaging.  Using modified Seldinger technique, a 10 French pigtail catheter drain was placed. Approximately 60 cc of highly viscous purulent fluid was aspirated. A sample sent to the lab.  The catheter pigtail was locked, and the catheter was sutured in position. A sterile bandage was placed.  The patient was then turned in supine position for drainage of a left lower quadrant abscess.  The left lower quadrant was prepped with chlorhexidinein a sterile fashion, and a sterile drape was applied covering the operative field. A sterile gown and sterile gloves were used for the procedure. Local anesthesia was provided with 1% Lidocaine.  Once the patient is prepped and draped sterilely, 1% lidocaine was used for local anesthesia. A small stab incision was made with 11 blade scalpel, and a trocar needle was advanced with CT guidance into fluid collection of the left lower quadrant. We confirmed position with aspiration of fluid and with CT imaging.  Using modified Seldinger technique, a 10 French pigtail catheter drain was placed into the left lower quadrant fluid collection.  Approximately 50 cc of turbid thin fluid was aspirated. Drainage bag was placed. Catheter was sutured in position and a sterile dressing was placed.  The patient tolerated the procedure well and remained hemodynamically stable throughout.  No complications were encountered and no significant blood loss was encountered.  FINDINGS: Initial CT imaging in the prone position demonstrates abscess within the pelvis, similar in size and location to prior cross-sectional imaging.  Images during the case demonstrate safe placement of 10 French catheter into pelvic abscess. 60 cc of viscous purulent fluid was removed.  In knee supine position, CT image demonstrates fluid collection of the left lower quadrant (images stored in PACs are mislabeled and reversed).  After placement of 10 French catheter, there is significant decompression of this fluid collection. 50 cc of turbid thin fluid removed.  IMPRESSION: Status post placement of right trans gluteal 10 French pigtail catheter into pelvic abscess, as well as percutaneous 10 French catheter placement into left lower quadrant fluid collection. Sample was sent from each collection to the lab for analysis.  Signed,  Dulcy Fanny. Earleen Newport, DO  [facility]  [facility]   Electronically Signed   By: Corrie Mckusick D.O.   On: 02/15/2015 15:55    Impression: 1.  Crohn's ileitis with longstanding partial obstruction, now with abscess formation. 2.  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  Plan: 1.  There is no role for intensification of medical therapy of her Crohn's disease in the setting of acute infection. 2.  Agree with percutaneous drainage. 3.  Would advocate definitive surgical treatment of her Crohn's (no doubt involving a resection of the strictured and perforated segment(s) of her ileum) as soon as it is felt to be medically appropriate. 4.  Once the patient is resected, I would initiate intensified  medical treatment, probably 6MP but possibly an anti-TNF, with follow-up colonoscopy 6 months later to assess the neo-terminal ileum for evidence of recurrent inflammation, in which case treatment would have to be intensified. 5.  Unless surgery feels otherwise, now that drainage has been achieved, I think patient's steroid dose  could be rapidly reduced to maintenance levels (e.g., 20 mg of IV solumedrol per day) 6.  Considering that the patient has chronic obstruction and no vomiting, it may be possible to discontinue her NG suction in the near future. 7.  Agree with surgery that this patient with probable perforating/fistulizing disease would benefit from TNA.  Will follow at a distance with you, since I will be deferring to Surgery for her management.  However, please call at any time if earlier input is desired.   LOS: 0 days   Sheila Gervasi V  02/15/2015, 7:13 PM   Pager [phone] If no answer or after 5 PM call [phone]

## 2015-02-15 NOTE — Sedation Documentation (Signed)
Pt will be placed prone for 1st drain placement

## 2015-02-15 NOTE — Sedation Documentation (Signed)
Drain placed in R lower back area- 60 cc bloody fluid aspirated for cx

## 2015-02-15 NOTE — Sedation Documentation (Signed)
Abscess drain from R lower abd area aspirated- 50 cc bloody drainage

## 2015-02-15 NOTE — ED Notes (Signed)
Report given to Becky, RN 

## 2015-02-15 NOTE — ED Provider Notes (Signed)
CSN: [id number]     Arrival date & time 02/15/15  0257 History   First MD Initiated Contact with Patient 02/15/15 [id number]     Chief Complaint  Patient presents with  . Abdominal Pain    HPI   Krystal [name] is a 54 y.o. female with a PMH of crohn's disease, SBO who presents to the ED with abdominal pain. She reports LLQ abdominal pain, which started at 11:00PM last night and has been constant since that time. She states movement exacerbates her pain. She has taken her home methadone, without symptom relief. She reports nausea. Denies vomiting. She reports her last bowel movement was yesterday prior to the onset of her symptoms, denies blood in stool, diarrhea. Reports subjective fever. Reports shortness of breath, which she attributes to pain. Denies lightheadedness, dizziness, syncope, chest pain, dysuria, urgency, frequency.     Past Medical History  Diagnosis Date  . SBO (small bowel obstruction) 11/2014  . Crohn's colitis    Past Surgical History  Procedure Laterality Date  . Abdominal hysterectomy    . Appendectomy    . Tonsillectomy     No family history on file. Social History  Substance Use Topics  . Smoking status: Former Smoker    Quit date: 11/12/2014  . Smokeless tobacco: Never Used  . Alcohol Use: No   OB History    No data available      Review of Systems  Constitutional: Positive for fever, chills and fatigue.  Respiratory: Positive for shortness of breath.   Cardiovascular: Negative for chest pain, palpitations and leg swelling.  Gastrointestinal: Positive for nausea, abdominal pain and abdominal distention. Negative for vomiting, diarrhea and constipation.  Genitourinary: Negative for dysuria, urgency and frequency.  Musculoskeletal: Negative for myalgias, back pain, arthralgias, neck pain and neck stiffness.  Skin: Negative for color change, pallor, rash and wound.  Neurological: Negative for dizziness, syncope, weakness, light-headedness, numbness and  headaches.  All other systems reviewed and are negative.     Allergies  Sulfa antibiotics  Home Medications   Prior to Admission medications   Medication Sig Start Date End Date Taking? Authorizing Provider  methadone (DOLOPHINE) 10 MG/ML solution Take 4 mLs (40 mg total) by mouth daily. Patient taking differently: Take 43 mg by mouth daily.  11/22/14  Yes Jones Bales, MD  polyethylene glycol (MIRALAX / GLYCOLAX) packet Take 17 g by mouth daily.   Yes Historical Provider, MD  predniSONE (DELTASONE) 10 MG tablet Take 10 mg by mouth daily with breakfast.   Yes Historical Provider, MD  feeding supplement, RESOURCE BREEZE, (RESOURCE BREEZE) LIQD Take 1 Container by mouth 3 (three) times daily between meals. Patient not taking: Reported on 02/15/2015 11/23/14   Riccardo Dubin, MD  predniSONE (DELTASONE) 20 MG tablet 3 tabs po daily x 5 days, then 2 tabs x 5 days, then 1.5 tabs x 5 days, then 1 tab x 5 days, then 0.5 tabs x 5 days Patient not taking: Reported on 02/15/2015 11/23/14   Rushil Sherrye Payor, MD    BP 110/54 mmHg  Pulse 85  Temp(Src) 99.3 F (37.4 C) (Oral)  Resp 18  Wt 104 lb 7 oz (47.373 kg)  SpO2 100% Physical Exam  Constitutional: She is oriented to person, place, and time. She appears well-developed and well-nourished. She appears distressed.  Patient in moderate distress due to pain.  HENT:  Head: Normocephalic and atraumatic.  Right Ear: External ear normal.  Left Ear: External ear normal.  Nose:  Nose normal.  Mouth/Throat: Uvula is midline, oropharynx is clear and moist and mucous membranes are normal.  Eyes: Conjunctivae, EOM and lids are normal. Pupils are equal, round, and reactive to light. Right eye exhibits no discharge. Left eye exhibits no discharge. No scleral icterus.  Neck: Normal range of motion. Neck supple.  Cardiovascular: Normal rate, regular rhythm, normal heart sounds, intact distal pulses and normal pulses.   Pulmonary/Chest: Effort normal and  breath sounds normal. No respiratory distress. She has no wheezes. She has no rales.  Abdominal: Soft. Normal appearance and bowel sounds are normal. She exhibits distension. She exhibits no mass. There is tenderness. There is guarding. There is no rigidity and no rebound.  Abdomen slightly distended. Significant TTP of LLQ. Palpation of RLQ produces pain in bilateral lower quadrants.  Musculoskeletal: Normal range of motion. She exhibits no edema or tenderness.  Neurological: She is alert and oriented to person, place, and time.  Skin: Skin is warm and intact. No rash noted. She is diaphoretic. No erythema. No pallor.  Psychiatric: She has a normal mood and affect. Her speech is normal and behavior is normal. Judgment and thought content normal.  Nursing note and vitals reviewed.   ED Course  Procedures (including critical care time)  Labs Review Labs Reviewed  LIPASE, BLOOD - Abnormal; Notable for the following:    Lipase 15 (*)    All other components within normal limits  COMPREHENSIVE METABOLIC PANEL - Abnormal; Notable for the following:    Glucose, Bld 136 (*)    Calcium 8.8 (*)    Total Protein 6.3 (*)    Albumin 2.8 (*)    ALT 10 (*)    All other components within normal limits  CBC - Abnormal; Notable for the following:    WBC 25.1 (*)    RBC 3.47 (*)    Hemoglobin 7.3 (*)    HCT 25.3 (*)    MCV 72.9 (*)    MCH 21.0 (*)    MCHC 28.9 (*)    RDW 16.9 (*)    All other components within normal limits  URINALYSIS, ROUTINE W REFLEX MICROSCOPIC (NOT AT [facility]) - Abnormal; Notable for the following:    APPearance HAZY (*)    All other components within normal limits  I-STAT CG4 LACTIC ACID, ED    Imaging Review Ct Abdomen Pelvis W Contrast  02/15/2015   CLINICAL DATA:  Abdominal pain, left lower quadrant.  EXAM: CT ABDOMEN AND PELVIS WITH CONTRAST  TECHNIQUE: Multidetector CT imaging of the abdomen and pelvis was performed using the standard protocol following bolus  administration of intravenous contrast.  CONTRAST:  12mL OMNIPAQUE IOHEXOL 300 MG/ML  SOLN  COMPARISON:  11/13/2014  FINDINGS: Lower chest and abdominal wall:  No contributory findings.  Hepatobiliary: No focal liver abnormality.Chronic dilation of the common bile duct to 8 mm. No visible calcified choledocholithiasis. Liver function tests are pending.  Pancreas: Stable mild prominence of the main pancreatic duct.  Spleen: Unremarkable.  Adrenals/Urinary Tract: Negative adrenals. No hydronephrosis or stone. Too small to characterize low-density in the lower pole left kidney, stable and statistically a cyst. Unremarkable bladder.  Reproductive:Hysterectomy and possible oophorectomies.  Stomach/Bowel: Multi focal skip areas of bowel inflammation with wall thickening from submucosal edema and hypervascular mucosa. Findings consistent with Crohn's disease. 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 These are marked in the right abdomen on image 55 series 2. There are 5 distinct fluid  collections with rim enhancement, some with internal gas, consistent with abscess:  1. Isolated right pericolic gutter at 15 mm maximal diameter. 2. Rectovaginal recess at 42 mm diameter. 3. Posterior to the bladder is a flat collection measuring 38 mm in width by 12 mm in thickness. 4. Ventral to this collection is a discrete appearing bilobed collection with small communicating channel. The upper collection measures 53 mm, the lower 40 mm. 5. Ventral to this collection is a discrete appearing collection which measures 49 x 14 mm axial dimension. 6. 7. Intermittent areas of small bowel distention compatible with partial obstruction.  Vascular/Lymphatic: No acute vascular abnormality. Reactive mesenteric adenopathy.  Musculoskeletal: No acute abnormalities.  IMPRESSION: 1. Crohn's with progressive penetrating disease and multiloculated pelvic and right flank abscess, as described above.  2. Chronically dilated common bile duct. Correlate with pending liver function tests.   Electronically Signed   By: Monte Fantasia M.D.   On: 02/15/2015 07:25     I have personally reviewed and evaluated these images and lab results as part of my medical decision-making.   EKG Interpretation None      MDM   Final diagnoses:  Abdominal pain   54 year old female presents with LLQ abdominal pain since 11:00PM last night. Reports abdominal distention and nausea. Last BM yesterday evening prior to onset of symptoms. Denies vomiting, diarrhea, blood in stool. Reports subjective fever. Patient admitted in June 2016 with partial SBO, thought to be related to crohn's disease. At that time, patient was managed medically with steroids and subsequently discharged.  Patient is afebrile. Vital signs stable. No tachycardia, systolic BP 518A. Abdomen slightly distended, TTP in LLQ. Palpation of RLQ produces pain in bilateral lower quadrants. Leukocytosis with WBC 25.1, anemia with hemoglobin 7.3. Lactic acid within normal limits. UA negative for infection. CT abdomen pelvis demonstrates crohn's with progressive penetrating disease and multiloculated pelvic and right flank abscess. Phenergan given for nausea, pain controlled with dilaudid. IV cipro and flagyl started in the ED. GI consulted. Spoke with Dr. Cristina Gong, who will see. Advised to admit patient and consult surgery, given likelihood patient will require surgical intervention. Internal medicine consulted. Patient to be admitted to [facility] unit, attending Dr. Eppie Gibson. Surgery consulted. Spoke with surgery, who will see.  BP 110/54 mmHg  Pulse 85  Temp(Src) 99.3 F (37.4 C) (Oral)  Resp 18  Wt 104 lb 7 oz (47.373 kg)  SpO2 100%    Marella Chimes, PA-C 02/15/15 [place], MD 02/16/15 2315

## 2015-02-15 NOTE — ED Notes (Addendum)
Andee Poles, Secretary, paging admitting MD so may advise of pt's temp - 102.4.

## 2015-02-15 NOTE — Consult Note (Signed)
Reason for Consult:Crohn's Disease   Krystal [name] is an 54 y.o. female.  HPI: Krystal [name] was in her usual state of health until last night about 2300 when she experienced sudden onset severe LLQ pain. She has a history of Crohn's and was admitted 3 months ago for an exacerbation. She has chronic pain from this. She admits to fever and chills and nausea but no emesis. BM's have been normal for her up until now. She sees Dr. Wallis Mart for GI and was scheduled to see Dr. Alphonsa Overall later this month.  Past Medical History  Diagnosis Date  . SBO (small bowel obstruction) 11/2014  . Crohn's colitis     Past Surgical History  Procedure Laterality Date  . Abdominal hysterectomy    . Appendectomy    . Tonsillectomy      No family history on file.  Social History:  reports that she quit smoking about 3 months ago. She has never used smokeless tobacco. She reports that she does not drink alcohol or use illicit drugs.  Allergies:  Allergies  Allergen Reactions  . Sulfa Antibiotics Rash   Medications: I have reviewed the patient's current medications.  Results for orders placed or performed during the hospital encounter of 02/15/15 (from the past 48 hour(s))  Lipase, blood     Status: Abnormal   Collection Time: 02/15/15  3:15 AM  Result Value Ref Range   Lipase 15 (L) 22 - 51 U/L  Comprehensive metabolic panel     Status: Abnormal   Collection Time: 02/15/15  3:15 AM  Result Value Ref Range   Sodium 139 135 - 145 mmol/L   Potassium 4.1 3.5 - 5.1 mmol/L   Chloride 104 101 - 111 mmol/L   CO2 25 22 - 32 mmol/L   Glucose, Bld 136 (H) 65 - 99 mg/dL   BUN 11 6 - 20 mg/dL   Creatinine, Ser 0.80 0.44 - 1.00 mg/dL   Calcium 8.8 (L) 8.9 - 10.3 mg/dL   Total Protein 6.3 (L) 6.5 - 8.1 g/dL   Albumin 2.8 (L) 3.5 - 5.0 g/dL   AST 15 15 - 41 U/L   ALT 10 (L) 14 - 54 U/L   Alkaline Phosphatase 57 38 - 126 U/L   Total Bilirubin 0.3 0.3 - 1.2 mg/dL   GFR calc non Af Amer >60 >60 mL/min   GFR  calc Af Amer >60 >60 mL/min    Comment: (NOTE) The eGFR has been calculated using the CKD EPI equation. This calculation has not been validated in all clinical situations. eGFR's persistently <60 mL/min signify possible Chronic Kidney Disease.    Anion gap 10 5 - 15  CBC     Status: Abnormal   Collection Time: 02/15/15  3:15 AM  Result Value Ref Range   WBC 25.1 (H) 4.0 - 10.5 K/uL   RBC 3.47 (L) 3.87 - 5.11 MIL/uL   Hemoglobin 7.3 (L) 12.0 - 15.0 g/dL   HCT 25.3 (L) 36.0 - 46.0 %   MCV 72.9 (L) 78.0 - 100.0 fL   MCH 21.0 (L) 26.0 - 34.0 pg   MCHC 28.9 (L) 30.0 - 36.0 g/dL   RDW 16.9 (H) 11.5 - 15.5 %   Platelets 387 150 - 400 K/uL  Urinalysis, Routine w reflex microscopic (not at [facility])     Status: Abnormal   Collection Time: 02/15/15  4:09 AM  Result Value Ref Range   Color, Urine YELLOW YELLOW   APPearance HAZY (A) CLEAR  Specific Gravity, Urine 1.019 1.005 - 1.030   pH 5.5 5.0 - 8.0   Glucose, UA NEGATIVE NEGATIVE mg/dL   Hgb urine dipstick NEGATIVE NEGATIVE   Bilirubin Urine NEGATIVE NEGATIVE   Ketones, ur NEGATIVE NEGATIVE mg/dL   Protein, ur NEGATIVE NEGATIVE mg/dL   Urobilinogen, UA 0.2 0.0 - 1.0 mg/dL   Nitrite NEGATIVE NEGATIVE   Leukocytes, UA NEGATIVE NEGATIVE    Comment: MICROSCOPIC NOT DONE ON URINES WITH NEGATIVE PROTEIN, BLOOD, LEUKOCYTES, NITRITE, OR GLUCOSE <1000 mg/dL.  I-Stat CG4 Lactic Acid, ED     Status: None   Collection Time: 02/15/15  6:11 AM  Result Value Ref Range   Lactic Acid, Venous 1.33 0.5 - 2.0 mmol/L    Ct Abdomen Pelvis W Contrast  02/15/2015   CLINICAL DATA:  Abdominal pain, left lower quadrant.  EXAM: CT ABDOMEN AND PELVIS WITH CONTRAST  TECHNIQUE: Multidetector CT imaging of the abdomen and pelvis was performed using the standard protocol following bolus administration of intravenous contrast.  CONTRAST:  49mL OMNIPAQUE IOHEXOL 300 MG/ML  SOLN  COMPARISON:  11/13/2014  FINDINGS: Lower chest and abdominal wall:  No contributory  findings.  Hepatobiliary: No focal liver abnormality.Chronic dilation of the common bile duct to 8 mm. No visible calcified choledocholithiasis. Liver function tests are pending.  Pancreas: Stable mild prominence of the main pancreatic duct.  Spleen: Unremarkable.  Adrenals/Urinary Tract: Negative adrenals. No hydronephrosis or stone. Too small to characterize low-density in the lower pole left kidney, stable and statistically a cyst. Unremarkable bladder.  Reproductive:Hysterectomy and possible oophorectomies.  Stomach/Bowel: Multi focal skip areas of bowel inflammation with wall thickening from submucosal edema and hypervascular mucosa. Findings consistent with Crohn's disease. 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 These are marked in the right abdomen on image 55 series 2. There are 5 distinct fluid collections with rim enhancement, some with internal gas, consistent with abscess:  1. Isolated right pericolic gutter at 15 mm maximal diameter. 2. Rectovaginal recess at 42 mm diameter. 3. Posterior to the bladder is a flat collection measuring 38 mm in width by 12 mm in thickness. 4. Ventral to this collection is a discrete appearing bilobed collection with small communicating channel. The upper collection measures 53 mm, the lower 40 mm. 5. Ventral to this collection is a discrete appearing collection which measures 49 x 14 mm axial dimension. 6. 7. Intermittent areas of small bowel distention compatible with partial obstruction.  Vascular/Lymphatic: No acute vascular abnormality. Reactive mesenteric adenopathy.  Musculoskeletal: No acute abnormalities.  IMPRESSION: 1. Crohn's with progressive penetrating disease and multiloculated pelvic and right flank abscess, as described above. 2. Chronically dilated common bile duct. Correlate with pending liver function tests.   Electronically Signed   By: Monte Fantasia M.D.   On: 02/15/2015 07:25    Review of  Systems  Constitutional: Positive for fever and chills. Negative for weight loss.  HENT: Negative.   Eyes: Negative.   Respiratory: Negative.   Cardiovascular: Negative.   Gastrointestinal: Positive for nausea, abdominal pain and diarrhea. Negative for vomiting, constipation, blood in stool and melena.  Genitourinary: Positive for dysuria. Negative for urgency, frequency, hematuria and flank pain.  Musculoskeletal: Negative.   Skin: Negative.   Neurological: Negative.   Endo/Heme/Allergies: Negative.   Psychiatric/Behavioral: Negative.    Blood pressure 112/53, pulse 87, temperature 99.3 F (37.4 C), temperature source Oral, resp. rate 18, weight 47.373 kg (104 lb 7 oz), SpO2 100 %. Physical  Exam  Vitals reviewed. Constitutional: She appears well-developed and well-nourished. She is cooperative. She appears distressed. Cervical collar and nasal cannula in place.  HENT:  Head: Normocephalic and atraumatic. Head is without raccoon's eyes, without Battle's sign, without abrasion, without contusion and without laceration.  Right Ear: Hearing, tympanic membrane and ear canal normal. No lacerations. No drainage or tenderness. No foreign bodies. Tympanic membrane is not perforated. No hemotympanum.  Left Ear: Hearing, tympanic membrane and ear canal normal. No lacerations. No drainage or tenderness. No foreign bodies. Tympanic membrane is not perforated. No hemotympanum.  Nose: No nose lacerations, sinus tenderness, nasal deformity or nasal septal hematoma. No epistaxis.  Mouth/Throat: Uvula is midline and mucous membranes are normal. No lacerations.  Eyes: Conjunctivae, EOM and lids are normal. Right eye exhibits no discharge. Left eye exhibits no discharge. No scleral icterus.  Neck: Trachea normal and normal range of motion. Neck supple. No JVD present. No spinous process tenderness and no muscular tenderness present. Carotid bruit is not present. No tracheal deviation present. No thyromegaly  present.  Cardiovascular: Regular rhythm, normal heart sounds, intact distal pulses and normal pulses.  Tachycardia present.  Exam reveals no gallop and no friction rub.   No murmur heard. Respiratory: Effort normal and breath sounds normal. No stridor. No respiratory distress. She has no wheezes. She has no rales. She exhibits no tenderness, no bony tenderness, no laceration and no crepitus.  GI: Soft. Normal appearance and bowel sounds are normal. She exhibits distension. There is tenderness in the left lower quadrant. There is rebound and guarding. There is no rigidity and no CVA tenderness.  Musculoskeletal: Normal range of motion. She exhibits no edema or tenderness.  Lymphadenopathy:    She has no cervical adenopathy.  Neurological: She is alert. She has normal strength. No cranial nerve deficit or sensory deficit. GCS eye subscore is 4. GCS verbal subscore is 5. GCS motor subscore is 6.  Skin: Skin is warm, dry and intact. She is not diaphoretic.  Psychiatric: She has a normal mood and affect. Her speech is normal and behavior is normal.    Assessment/Plan: Crohn's disease -- Admit to medicine, NPO for now, IR consult for perc drainage of abscesses, treat pain. Surgery will follow along.    Lisette Abu, PA-C Pager: [phone] 02/15/2015, 10:07 AM

## 2015-02-15 NOTE — Procedures (Signed)
Interventional Radiology Procedure Note  Procedure:  CT guided abscess drain x 2.   First: right transgluteal into pelvic abscess.  Highly viscous purulent material aspirated.  60cc evacuated.  Gravity drainage. Sample to lab. Second:  Left Lower quadrant fluid drained.  Serous turbid fluid.  50cc aspirated.  Gravity drainage. Sample to lab.  Complications: None Recommendations:  -  Follow up labs - record output.  - Routine care   Signed,  Dulcy Fanny. Earleen Newport, DO

## 2015-02-15 NOTE — ED Notes (Signed)
Pt states she takes methadone everyday at 5am and cannot miss a dose. PT has her dose in the car and requesting to take. Nurse and Dr. Ree Kida.

## 2015-02-16 DIAGNOSIS — Z9889 Other specified postprocedural states: Secondary | ICD-10-CM

## 2015-02-16 DIAGNOSIS — K50812 Crohn's disease of both small and large intestine with intestinal obstruction: Secondary | ICD-10-CM

## 2015-02-16 LAB — CBC
HEMATOCRIT: 23.9 % — AB (ref 36.0–46.0)
HEMOGLOBIN: 7.1 g/dL — AB (ref 12.0–15.0)
MCH: 21.5 pg — AB (ref 26.0–34.0)
MCHC: 29.7 g/dL — AB (ref 30.0–36.0)
MCV: 72.4 fL — ABNORMAL LOW (ref 78.0–100.0)
Platelets: 328 10*3/uL (ref 150–400)
RBC: 3.3 MIL/uL — AB (ref 3.87–5.11)
RDW: 16.7 % — ABNORMAL HIGH (ref 11.5–15.5)
WBC: 31.4 10*3/uL — ABNORMAL HIGH (ref 4.0–10.5)

## 2015-02-16 LAB — BASIC METABOLIC PANEL
ANION GAP: 6 (ref 5–15)
BUN: 9 mg/dL (ref 6–20)
CALCIUM: 8.8 mg/dL — AB (ref 8.9–10.3)
CO2: 27 mmol/L (ref 22–32)
Chloride: 105 mmol/L (ref 101–111)
Creatinine, Ser: 0.75 mg/dL (ref 0.44–1.00)
GLUCOSE: 148 mg/dL — AB (ref 65–99)
POTASSIUM: 3.9 mmol/L (ref 3.5–5.1)
Sodium: 138 mmol/L (ref 135–145)

## 2015-02-16 LAB — MAGNESIUM: MAGNESIUM: 2.1 mg/dL (ref 1.7–2.4)

## 2015-02-16 LAB — HEPATITIS C ANTIBODY (REFLEX)

## 2015-02-16 LAB — HCV COMMENT:

## 2015-02-16 MED ORDER — HYDROMORPHONE HCL 1 MG/ML IJ SOLN
2.0000 mg | INTRAMUSCULAR | Status: DC
  Administered 2015-02-16 – 2015-02-19 (×18): 2 mg via INTRAVENOUS
  Filled 2015-02-16 (×18): qty 2

## 2015-02-16 MED ORDER — HYDROMORPHONE HCL 1 MG/ML IJ SOLN: 2.0000 mg | Freq: Four times a day (QID) | INTRAMUSCULAR | Status: DC

## 2015-02-16 MED ORDER — INFLUENZA VAC SPLIT QUAD 0.5 ML IM SUSY
0.5000 mL | PREFILLED_SYRINGE | INTRAMUSCULAR | Status: DC
  Filled 2015-02-16: qty 0.5

## 2015-02-16 MED ORDER — PHENOL 1.4 % MT LIQD: 2.0000 | OROMUCOSAL | Status: DC | PRN

## 2015-02-16 MED ORDER — METHYLPREDNISOLONE SODIUM SUCC 125 MG IJ SOLR: 60.0000 mg | INTRAMUSCULAR | Status: DC

## 2015-02-16 MED ORDER — METHYLPREDNISOLONE SODIUM SUCC 125 MG IJ SOLR
50.0000 mg | INTRAMUSCULAR | Status: DC
  Administered 2015-02-17: 50 mg via INTRAVENOUS
  Filled 2015-02-16: qty 2

## 2015-02-16 NOTE — Progress Notes (Signed)
PARENTERAL NUTRITION CONSULT NOTE - INITIAL  Pharmacy Consult for TPN Indication: SBO and Prolonged NPO status  Allergies  Allergen Reactions  . Sulfa Antibiotics Rash    Patient Measurements: Height: 5\' 2"  (157.5 cm) Weight: 117 lb 11.6 oz (53.4 kg) IBW/kg (Calculated) : 50.1  Vital Signs: Temp: 97.9 F (36.6 C) (09/11 0547) Temp Source: Oral (09/11 0547) BP: 93/46 mmHg (09/11 0700) Pulse Rate: 65 (09/11 0547) Intake/Output from previous day: 09/10 0701 - 09/11 0700 In: 1581.3 [I.V.:1481.3; IV Piggyback:100] Out: 1200 [Urine:1200] Intake/Output from this shift:    Labs:  Recent Labs  02/15/15 0315 02/15/15 0925 02/16/15 0407  WBC 25.1* 32.9* 31.4*  HGB 7.3* 7.7* 7.1*  HCT 25.3* 26.3* 23.9*  PLT 387 353 328  APTT  --  26  --   INR  --  1.15  --      Recent Labs  02/15/15 0315 02/15/15 0925 02/16/15 0407  NA 139  --  138  K 4.1  --  3.9  CL 104  --  105  CO2 25  --  27  GLUCOSE 136*  --  148*  BUN 11  --  9  CREATININE 0.80  --  0.75  CALCIUM 8.8*  --  8.8*  MG  --  1.6* 2.1  PHOS  --  3.2  --   PROT 6.3*  --   --   ALBUMIN 2.8*  --   --   AST 15  --   --   ALT 10*  --   --   ALKPHOS 57  --   --   BILITOT 0.3  --   --   PREALBUMIN  --  20.5  --    Estimated Creatinine Clearance: 65.1 mL/min (by C-G formula based on Cr of 0.75).   No results for input(s): GLUCAP in the last 72 hours.  Medical History: Past Medical History  Diagnosis Date  . SBO (small bowel obstruction) 11/2014  . Crohn's colitis      Insulin Requirements in the past 24 hours:  None  Nutritional Goals:  RD consulted  Current Nutrition:  NPO  Assessment: 53 YO female presents w/ LLQ pain, nausea, and abd distension. Hx of Crohn's and bowel obstruction. Zosyn was started for R pelvic abscesses. Pt reports no issues up until the night of 9/9 when she started having abdominal pain. Pharnacy consulted to start TPN for SBO and presumed prolonged NPO status. Since patient  has only had poor diet for a few days will plan to wait till at least day 5 (9/13) before starting TPN. Also patient needs a PICC line placed and are holding off on that until Blood cx's negative for at least 48 hours. Surgery does plan to take to surgery but is waiting days to weeks for current abscess/inflammatory process to improve.  Surgeries/Procedures:  GI: Hx of Crohn's disease. Abdominal pain has greatly improved over last couple days. Albumin low at 2.8   Endo: No hx of DM. AM glucose ok. methylpred  Lytes: Lytes wnl. Phos and Mg ok   Renal: SCr at baseline 0.75, CrCl ~60 ml/min. MIVF: lactated ringers @ 142ml/hr.   Pulm: RA  Cards: BP soft and HR ok. No CV meds  Hepatobil: LFTs wnl  Neuro: dilaudid prn  ID: Day #2 of zosyn for crohn's colits with right pelvic abscesses. To get drainage and drain placement of abscesses today. Tmax up to 102.3, now afebrile. WBC elevated at 31.4  9/10 Blood cx >  pending  9/10 Zosyn >>  Best Practices: SCDs  TPN Access: PICC ordered but not placed  TPN start date: 9/13?  Plan:  Will wait till day #5 (9/13) of NPO to start TPN Also awaiting PICC line placement after blood cx's negative for at least 48 hours Consider ordering TPN labs on Tuesday morning Will continue to monitor F/U RD recommendations  Edmar Blankenburg J 02/16/2015,11:17 AM

## 2015-02-16 NOTE — Progress Notes (Signed)
Referring Physician(s): Dr. Donne Hazel  Chief Complaint:  Follow up after placement if percutaneous drain to treat abscess secondary to Crohn's colitis   Subjective:  Krystal [name] is feeling much better today.  She states the abdominal pain has greatly improved. She has an NGT in place.  Allergies: Sulfa antibiotics  Medications: Prior to Admission medications   Medication Sig Start Date End Date Taking? Authorizing Provider  methadone (DOLOPHINE) 10 MG/ML solution Take 4 mLs (40 mg total) by mouth daily. Patient taking differently: Take 43 mg by mouth daily.  11/22/14  Yes Jones Bales, MD  polyethylene glycol (MIRALAX / GLYCOLAX) packet Take 17 g by mouth daily.   Yes Historical Provider, MD  predniSONE (DELTASONE) 10 MG tablet Take 10 mg by mouth daily with breakfast.   Yes Historical Provider, MD  feeding supplement, RESOURCE BREEZE, (RESOURCE BREEZE) LIQD Take 1 Container by mouth 3 (three) times daily between meals. Patient not taking: Reported on 02/15/2015 11/23/14   Riccardo Dubin, MD  predniSONE (DELTASONE) 20 MG tablet 3 tabs po daily x 5 days, then 2 tabs x 5 days, then 1.5 tabs x 5 days, then 1 tab x 5 days, then 0.5 tabs x 5 days Patient not taking: Reported on 02/15/2015 11/23/14   Riccardo Dubin, MD     Vital Signs: BP 93/46 mmHg  Pulse 65  Temp(Src) 97.9 F (36.6 C) (Oral)  Resp 16  Ht 5\' 2"  (1.575 m)  Wt 117 lb 11.6 oz (53.4 kg)  BMI 21.53 kg/m2  SpO2 100%  Physical Exam   Awake and Alert Abdomen less tender today Less distended Right posterior perc drain intact, looks good. Dark brown drainage. ~ 25 mls in the bag Left anterior perc drain, intact, also looks good. Lighter blood tinged drainage. ~15 mls in the bag.  Imaging: Ct Abdomen Pelvis W Contrast  02/15/2015   CLINICAL DATA:  Abdominal pain, left lower quadrant.  EXAM: CT ABDOMEN AND PELVIS WITH CONTRAST  TECHNIQUE: Multidetector CT imaging of the abdomen and pelvis was performed using the  standard protocol following bolus administration of intravenous contrast.  CONTRAST:  10mL OMNIPAQUE IOHEXOL 300 MG/ML  SOLN  COMPARISON:  11/13/2014  FINDINGS: Lower chest and abdominal wall:  No contributory findings.  Hepatobiliary: No focal liver abnormality.Chronic dilation of the common bile duct to 8 mm. No visible calcified choledocholithiasis. Liver function tests are pending.  Pancreas: Stable mild prominence of the main pancreatic duct.  Spleen: Unremarkable.  Adrenals/Urinary Tract: Negative adrenals. No hydronephrosis or stone. Too small to characterize low-density in the lower pole left kidney, stable and statistically a cyst. Unremarkable bladder.  Reproductive:Hysterectomy and possible oophorectomies.  Stomach/Bowel: Multi focal skip areas of bowel inflammation with wall thickening from submucosal edema and hypervascular mucosa. Findings consistent with Crohn's disease. 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 These are marked in the right abdomen on image 55 series 2. There are 5 distinct fluid collections with rim enhancement, some with internal gas, consistent with abscess:  1. Isolated right pericolic gutter at 15 mm maximal diameter. 2. Rectovaginal recess at 42 mm diameter. 3. Posterior to the bladder is a flat collection measuring 38 mm in width by 12 mm in thickness. 4. Ventral to this collection is a discrete appearing bilobed collection with small communicating channel. The upper collection measures 53 mm, the lower 40 mm. 5. Ventral to this collection is a discrete appearing collection which measures 49  x 14 mm axial dimension. 6. 7. Intermittent areas of small bowel distention compatible with partial obstruction.  Vascular/Lymphatic: No acute vascular abnormality. Reactive mesenteric adenopathy.  Musculoskeletal: No acute abnormalities.  IMPRESSION: 1. Crohn's with progressive penetrating disease and multiloculated pelvic and right  flank abscess, as described above. 2. Chronically dilated common bile duct. Correlate with pending liver function tests.   Electronically Signed   By: Monte Fantasia M.D.   On: 02/15/2015 07:25   Ct Image Guided Fluid Drain By Catheter  02/15/2015   CLINICAL DATA:  54 year old female with a history of Crohn's disease, presents with multiloculated abscess of the abdomen pelvis.  She has been referred for evaluation for drain placement.  EXAM: CT GUIDED ABSCESS DRAIN BIOPSY OF PELVIC ABSCESS AND LEFT LOWER QUADRANT ABDOMINAL ABSCESS  ANESTHESIA/SEDATION: 3.0  Mg IV Versed; 150 mcg IV Fentanyl  Total Moderate Sedation Time: 45 minutes.  PROCEDURE: The procedure risks, benefits, and alternatives were explained to the patient. Questions regarding the procedure were encouraged and answered. The patient understands and consents to the procedure.  Patient was first position in prone position on CT gantry table and a CT of the abdomen pelvis was performed for planning purposes.  Safe angle approach was determined for right trans gluteal access to pelvic abscess, and the patient is prepped and draped in the usual sterile fashion.  The right gluteal region was prepped with chlorhexidinein a sterile fashion, and a sterile drape was applied covering the operative field. A sterile gown and sterile gloves were used for the procedure. Local anesthesia was provided with 1% Lidocaine.  Once the patient is prepped and draped sterilely, 1% lidocaine was used for local anesthesia. A small skin incision was made with 11 blade scalpel, and a trocar needle was advanced safely into pelvic abscess using CT guidance. We confirmed needle tip position with aspiration of purulent material and was CT imaging.  Using modified Seldinger technique, a 10 French pigtail catheter drain was placed. Approximately 60 cc of highly viscous purulent fluid was aspirated. A sample sent to the lab.  The catheter pigtail was locked, and the catheter was  sutured in position. A sterile bandage was placed.  The patient was then turned in supine position for drainage of a left lower quadrant abscess.  The left lower quadrant was prepped with chlorhexidinein a sterile fashion, and a sterile drape was applied covering the operative field. A sterile gown and sterile gloves were used for the procedure. Local anesthesia was provided with 1% Lidocaine.  Once the patient is prepped and draped sterilely, 1% lidocaine was used for local anesthesia. A small stab incision was made with 11 blade scalpel, and a trocar needle was advanced with CT guidance into fluid collection of the left lower quadrant. We confirmed position with aspiration of fluid and with CT imaging.  Using modified Seldinger technique, a 10 French pigtail catheter drain was placed into the left lower quadrant fluid collection. Approximately 50 cc of turbid thin fluid was aspirated. Drainage bag was placed. Catheter was sutured in position and a sterile dressing was placed.  The patient tolerated the procedure well and remained hemodynamically stable throughout.  No complications were encountered and no significant blood loss was encountered.  FINDINGS: Initial CT imaging in the prone position demonstrates abscess within the pelvis, similar in size and location to prior cross-sectional imaging.  Images during the case demonstrate safe placement of 10 French catheter into pelvic abscess. 60 cc of viscous purulent fluid was removed.  In knee supine position, CT image demonstrates fluid collection of the left lower quadrant (images stored in PACs are mislabeled and reversed).  After placement of 10 French catheter, there is significant decompression of this fluid collection. 50 cc of turbid thin fluid removed.  IMPRESSION: Status post placement of right trans gluteal 10 French pigtail catheter into pelvic abscess, as well as percutaneous 10 French catheter placement into left lower quadrant fluid collection. Sample  was sent from each collection to the lab for analysis.  Signed,  Krystal [name]. Earleen Newport, DO  [facility]  [facility]   Electronically Signed   By: Corrie Mckusick D.O.   On: 02/15/2015 15:55    Labs:  CBC:  Recent Labs  11/17/14 0330 02/15/15 0315 02/15/15 0925 02/16/15 0407  WBC 21.0* 25.1* 32.9* 31.4*  HGB 10.3* 7.3* 7.7* 7.1*  HCT 30.8* 25.3* 26.3* 23.9*  PLT 446* 387 353 328    COAGS:  Recent Labs  02/15/15 0925  INR 1.15  APTT 26    BMP:  Recent Labs  11/16/14 0417 11/21/14 0536 02/15/15 0315 02/16/15 0407  NA 143 135 139 138  K 4.3 4.1 4.1 3.9  CL 109 97* 104 105  CO2 25 29 25 27   GLUCOSE 144* 129* 136* 148*  BUN 23* 14 11 9   CALCIUM 8.8* 7.9* 8.8* 8.8*  CREATININE 0.75 0.73 0.80 0.75  GFRNONAA >60 >60 >60 >60  GFRAA >60 >60 >60 >60    LIVER FUNCTION TESTS:  Recent Labs  11/13/14 0258 11/14/14 0322 02/15/15 0315  BILITOT 0.5 0.4 0.3  AST 20 20 15   ALT 17 19 10*  ALKPHOS 124 73 57  PROT 6.5 5.4* 6.3*  ALBUMIN 2.7* 1.9* 2.8*    Assessment and Plan:  Crohn's colitis with abscess S/P CT guided right transgluteal drain and left lower quadrant drain on 9/100/16 by Dr. Earleen Newport  Continue Current Care Record output.  SignedMurrell Redden 02/16/2015, 11:04 AM   I spent a total of 15 Minutes at the the patient's bedside AND on the patient's hospital floor or unit, greater than 50% of which was counseling/coordinating care for f/u after CT guided drains x 2

## 2015-02-16 NOTE — Progress Notes (Signed)
Subjective: Patient reports much less abdominal pain Denies flatus  Objective: Vital signs in last 24 hours: Temp:  [97.9 F (36.6 C)-102.4 F (39.1 C)] 97.9 F (36.6 C) (09/11 0547) Pulse Rate:  [65-133] 65 (09/11 0547) Resp:  [15-22] 16 (09/11 0547) BP: (87-123)/(42-71) 93/46 mmHg (09/11 0700) SpO2:  [92 %-100 %] 100 % (09/11 0547) Weight:  [53.4 kg (117 lb 11.6 oz)] 53.4 kg (117 lb 11.6 oz) (09/11 0500) Last BM Date: 02/14/15  Intake/Output from previous day: 09/10 0701 - 09/11 0700 In: 1581.3 [I.V.:1481.3; IV Piggyback:100] Out: 1200 [Urine:1200] Intake/Output this shift:    Abdomen mildly distended, minimally tender, no peritonitis Drains seropurulent  Lab Results:   Recent Labs  02/15/15 0925 02/16/15 0407  WBC 32.9* 31.4*  HGB 7.7* 7.1*  HCT 26.3* 23.9*  PLT 353 328   BMET  Recent Labs  02/15/15 0315 02/16/15 0407  NA 139 138  K 4.1 3.9  CL 104 105  CO2 25 27  GLUCOSE 136* 148*  BUN 11 9  CREATININE 0.80 0.75  CALCIUM 8.8* 8.8*   PT/INR  Recent Labs  02/15/15 0925  LABPROT 14.9  INR 1.15   ABG No results for input(s): PHART, HCO3 in the last 72 hours.  Invalid input(s): PCO2, PO2  Studies/Results: Ct Abdomen Pelvis W Contrast  02/15/2015   CLINICAL DATA:  Abdominal pain, left lower quadrant.  EXAM: CT ABDOMEN AND PELVIS WITH CONTRAST  TECHNIQUE: Multidetector CT imaging of the abdomen and pelvis was performed using the standard protocol following bolus administration of intravenous contrast.  CONTRAST:  82mL OMNIPAQUE IOHEXOL 300 MG/ML  SOLN  COMPARISON:  11/13/2014  FINDINGS: Lower chest and abdominal wall:  No contributory findings.  Hepatobiliary: No focal liver abnormality.Chronic dilation of the common bile duct to 8 mm. No visible calcified choledocholithiasis. Liver function tests are pending.  Pancreas: Stable mild prominence of the main pancreatic duct.  Spleen: Unremarkable.  Adrenals/Urinary Tract: Negative adrenals. No  hydronephrosis or stone. Too small to characterize low-density in the lower pole left kidney, stable and statistically a cyst. Unremarkable bladder.  Reproductive:Hysterectomy and possible oophorectomies.  Stomach/Bowel: Multi focal skip areas of bowel inflammation with wall thickening from submucosal edema and hypervascular mucosa. Findings consistent with Crohn's disease. 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 These are marked in the right abdomen on image 55 series 2. There are 5 distinct fluid collections with rim enhancement, some with internal gas, consistent with abscess:  1. Isolated right pericolic gutter at 15 mm maximal diameter. 2. Rectovaginal recess at 42 mm diameter. 3. Posterior to the bladder is a flat collection measuring 38 mm in width by 12 mm in thickness. 4. Ventral to this collection is a discrete appearing bilobed collection with small communicating channel. The upper collection measures 53 mm, the lower 40 mm. 5. Ventral to this collection is a discrete appearing collection which measures 49 x 14 mm axial dimension. 6. 7. Intermittent areas of small bowel distention compatible with partial obstruction.  Vascular/Lymphatic: No acute vascular abnormality. Reactive mesenteric adenopathy.  Musculoskeletal: No acute abnormalities.  IMPRESSION: 1. Crohn's with progressive penetrating disease and multiloculated pelvic and right flank abscess, as described above. 2. Chronically dilated common bile duct. Correlate with pending liver function tests.   Electronically Signed   By: Monte Fantasia M.D.   On: 02/15/2015 07:25   Ct Image Guided Fluid Drain By Catheter  02/15/2015   CLINICAL DATA:  54 year old female with  a history of Crohn's disease, presents with multiloculated abscess of the abdomen pelvis.  She has been referred for evaluation for drain placement.  EXAM: CT GUIDED ABSCESS DRAIN BIOPSY OF PELVIC ABSCESS AND LEFT LOWER QUADRANT  ABDOMINAL ABSCESS  ANESTHESIA/SEDATION: 3.0  Mg IV Versed; 150 mcg IV Fentanyl  Total Moderate Sedation Time: 45 minutes.  PROCEDURE: The procedure risks, benefits, and alternatives were explained to the patient. Questions regarding the procedure were encouraged and answered. The patient understands and consents to the procedure.  Patient was first position in prone position on CT gantry table and a CT of the abdomen pelvis was performed for planning purposes.  Safe angle approach was determined for right trans gluteal access to pelvic abscess, and the patient is prepped and draped in the usual sterile fashion.  The right gluteal region was prepped with chlorhexidinein a sterile fashion, and a sterile drape was applied covering the operative field. A sterile gown and sterile gloves were used for the procedure. Local anesthesia was provided with 1% Lidocaine.  Once the patient is prepped and draped sterilely, 1% lidocaine was used for local anesthesia. A small skin incision was made with 11 blade scalpel, and a trocar needle was advanced safely into pelvic abscess using CT guidance. We confirmed needle tip position with aspiration of purulent material and was CT imaging.  Using modified Seldinger technique, a 10 French pigtail catheter drain was placed. Approximately 60 cc of highly viscous purulent fluid was aspirated. A sample sent to the lab.  The catheter pigtail was locked, and the catheter was sutured in position. A sterile bandage was placed.  The patient was then turned in supine position for drainage of a left lower quadrant abscess.  The left lower quadrant was prepped with chlorhexidinein a sterile fashion, and a sterile drape was applied covering the operative field. A sterile gown and sterile gloves were used for the procedure. Local anesthesia was provided with 1% Lidocaine.  Once the patient is prepped and draped sterilely, 1% lidocaine was used for local anesthesia. A small stab incision was made with  11 blade scalpel, and a trocar needle was advanced with CT guidance into fluid collection of the left lower quadrant. We confirmed position with aspiration of fluid and with CT imaging.  Using modified Seldinger technique, a 10 French pigtail catheter drain was placed into the left lower quadrant fluid collection. Approximately 50 cc of turbid thin fluid was aspirated. Drainage bag was placed. Catheter was sutured in position and a sterile dressing was placed.  The patient tolerated the procedure well and remained hemodynamically stable throughout.  No complications were encountered and no significant blood loss was encountered.  FINDINGS: Initial CT imaging in the prone position demonstrates abscess within the pelvis, similar in size and location to prior cross-sectional imaging.  Images during the case demonstrate safe placement of 10 French catheter into pelvic abscess. 60 cc of viscous purulent fluid was removed.  In knee supine position, CT image demonstrates fluid collection of the left lower quadrant (images stored in PACs are mislabeled and reversed).  After placement of 10 French catheter, there is significant decompression of this fluid collection. 50 cc of turbid thin fluid removed.  IMPRESSION: Status post placement of right trans gluteal 10 French pigtail catheter into pelvic abscess, as well as percutaneous 10 French catheter placement into left lower quadrant fluid collection. Sample was sent from each collection to the lab for analysis.  Signed,  Dulcy Fanny. Earleen Newport, DO  [facility]  [facility]   Electronically Signed   By: Corrie Mckusick D.O.   On: 02/15/2015 15:55    Anti-infectives: Anti-infectives    Start     Dose/Rate Route Frequency Ordered Stop   02/15/15 2130  ciprofloxacin (CIPRO) IVPB 400 mg  Status:  Discontinued     400 mg 200 mL/hr over 60 Minutes Intravenous Every 12 hours 02/15/15 0920 02/15/15 1117   02/15/15 1400   piperacillin-tazobactam (ZOSYN) IVPB 3.375 g     3.375 g 12.5 mL/hr over 240 Minutes Intravenous 3 times per day 02/15/15 1200     02/15/15 0745  ciprofloxacin (CIPRO) IVPB 400 mg     400 mg 200 mL/hr over 60 Minutes Intravenous  Once 02/15/15 0736 02/15/15 1109   02/15/15 0745  metroNIDAZOLE (FLAGYL) IVPB 500 mg  Status:  Discontinued     500 mg 100 mL/hr over 60 Minutes Intravenous  Once 02/15/15 0736 02/15/15 1117      Assessment/Plan: s/p * No surgery found *  SBO with abscess and fistula secondary to Crohn's  Continue conservative management, bowel rest, NG, etc. It may be days to weeks before she is ready for surgery to allow current abscess/inflamatory process to improve. Recommend PICC line placement and TNA   LOS: 1 day    Krystal [name] A 02/16/2015

## 2015-02-16 NOTE — Progress Notes (Signed)
Subjective: Patient reports feeling much better this morning. Pain is well controlled. Has not been able to have a BM or been passing flatus since admission.   Objective: Vital signs in last 24 hours: Filed Vitals:   02/16/15 0547 02/16/15 0700 02/16/15 0800 02/16/15 1345  BP: 87/51 93/46  89/45  Pulse: 65   59  Temp: 97.9 F (36.6 C)   98.1 F (36.7 C)  TempSrc: Oral   Oral  Resp: 16   18  Height:   5\' 2"  (1.575 m)   Weight:      SpO2: 100%   97%   Weight change: 13 lb 4.6 oz (6.027 kg)  Intake/Output Summary (Last 24 hours) at 02/16/15 1512 Last data filed at 02/16/15 1453  Gross per 24 hour  Intake 2720.83 ml  Output   1750 ml  Net 970.83 ml   Physical Exam GENERAL- alert, co-operative, appears as stated age, in distress HEENT- Atraumatic, normocephalic, PERRL, EOMI, oral mucosa appears moist CARDIAC- RRR, no murmurs, rubs or gallops. RESP- Moving equal volumes of air, and clear to auscultation bilaterally, no wheezes or crackles. ABDOMEN- Abdomen is soft and non-tender, no guarding or rebound. Hypoactive bowel sounds.   NEURO- No obvious Cr N abnormality, strenght upper and lower extremities- 5/5, Sensation intact- globally EXTREMITIES- pulse 2+, symmetric, no pedal edema. SKIN- Warm, dry, No rash or lesion. PSYCH- Normal mood and affect, appropriate thought content and speech.  Lab Results: Basic Metabolic Panel:  Recent Labs Lab 02/15/15 0315 02/15/15 0925 02/16/15 0407  NA 139  --  138  K 4.1  --  3.9  CL 104  --  105  CO2 25  --  27  GLUCOSE 136*  --  148*  BUN 11  --  9  CREATININE 0.80  --  0.75  CALCIUM 8.8*  --  8.8*  MG  --  1.6* 2.1  PHOS  --  3.2  --    Liver Function Tests:  Recent Labs Lab 02/15/15 0315  AST 15  ALT 10*  ALKPHOS 57  BILITOT 0.3  PROT 6.3*  ALBUMIN 2.8*    Recent Labs Lab 02/15/15 0315  LIPASE 15*   CBC:  Recent Labs Lab 02/15/15 0925 02/16/15 0407  WBC 32.9* 31.4*  NEUTROABS 27.7*  --   HGB 7.7*  7.1*  HCT 26.3* 23.9*  MCV 71.9* 72.4*  PLT 353 328   Coagulation:  Recent Labs Lab 02/15/15 0925  LABPROT 14.9  INR 1.15   Anemia Panel:  Recent Labs Lab 02/15/15 0925  FERRITIN 7*  TIBC 360  IRON 5*  RETICCTPCT 2.7   Urine Drug Screen: Drugs of Abuse     Component Value Date/Time   LABOPIA RESULTS UNAVAILABLE DUE TO INTERFERING SUBSTANCE* 11/14/2014 0853   COCAINSCRNUR NONE DETECTED 11/14/2014 0853   LABBENZ NONE DETECTED 11/14/2014 0853   AMPHETMU RESULTS UNAVAILABLE DUE TO INTERFERING SUBSTANCE* 11/14/2014 0853   THCU RESULTS UNAVAILABLE DUE TO INTERFERING SUBSTANCE* 11/14/2014 0853   LABBARB NONE DETECTED 11/14/2014 0853   Urinalysis:  Recent Labs Lab 02/15/15 0409  COLORURINE YELLOW  LABSPEC 1.019  PHURINE 5.5  GLUCOSEU NEGATIVE  HGBUR NEGATIVE  BILIRUBINUR NEGATIVE  KETONESUR NEGATIVE  PROTEINUR NEGATIVE  UROBILINOGEN 0.2  NITRITE NEGATIVE  LEUKOCYTESUR NEGATIVE   Micro Results: Recent Results (from the past 240 hour(s))  Culture, blood (routine x 2)     Status: None (Preliminary result)   Collection Time: 02/15/15  9:40 AM  Result Value Ref Range Status  Specimen Description BLOOD LEFT HAND  Final   Special Requests BOTTLES DRAWN AEROBIC ONLY 10CC  Final   Culture PENDING  Incomplete   Report Status PENDING  Incomplete   Studies/Results: Ct Abdomen Pelvis W Contrast  02/15/2015   CLINICAL DATA:  Abdominal pain, left lower quadrant.  EXAM: CT ABDOMEN AND PELVIS WITH CONTRAST  TECHNIQUE: Multidetector CT imaging of the abdomen and pelvis was performed using the standard protocol following bolus administration of intravenous contrast.  CONTRAST:  57mL OMNIPAQUE IOHEXOL 300 MG/ML  SOLN  COMPARISON:  11/13/2014  FINDINGS: Lower chest and abdominal wall:  No contributory findings.  Hepatobiliary: No focal liver abnormality.Chronic dilation of the common bile duct to 8 mm. No visible calcified choledocholithiasis. Liver function tests are pending.   Pancreas: Stable mild prominence of the main pancreatic duct.  Spleen: Unremarkable.  Adrenals/Urinary Tract: Negative adrenals. No hydronephrosis or stone. Too small to characterize low-density in the lower pole left kidney, stable and statistically a cyst. Unremarkable bladder.  Reproductive:Hysterectomy and possible oophorectomies.  Stomach/Bowel: Multi focal skip areas of bowel inflammation with wall thickening from submucosal edema and hypervascular mucosa. Findings consistent with Crohn's disease. 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 These are marked in the right abdomen on image 55 series 2. There are 5 distinct fluid collections with rim enhancement, some with internal gas, consistent with abscess:  1. Isolated right pericolic gutter at 15 mm maximal diameter. 2. Rectovaginal recess at 42 mm diameter. 3. Posterior to the bladder is a flat collection measuring 38 mm in width by 12 mm in thickness. 4. Ventral to this collection is a discrete appearing bilobed collection with small communicating channel. The upper collection measures 53 mm, the lower 40 mm. 5. Ventral to this collection is a discrete appearing collection which measures 49 x 14 mm axial dimension. 6. 7. Intermittent areas of small bowel distention compatible with partial obstruction.  Vascular/Lymphatic: No acute vascular abnormality. Reactive mesenteric adenopathy.  Musculoskeletal: No acute abnormalities.  IMPRESSION: 1. Crohn's with progressive penetrating disease and multiloculated pelvic and right flank abscess, as described above. 2. Chronically dilated common bile duct. Correlate with pending liver function tests.   Electronically Signed   By: Monte Fantasia M.D.   On: 02/15/2015 07:25   Ct Image Guided Fluid Drain By Catheter  02/15/2015   CLINICAL DATA:  54 year old female with a history of Crohn's disease, presents with multiloculated abscess of the abdomen pelvis.  She  has been referred for evaluation for drain placement.  EXAM: CT GUIDED ABSCESS DRAIN BIOPSY OF PELVIC ABSCESS AND LEFT LOWER QUADRANT ABDOMINAL ABSCESS  ANESTHESIA/SEDATION: 3.0  Mg IV Versed; 150 mcg IV Fentanyl  Total Moderate Sedation Time: 45 minutes.  PROCEDURE: The procedure risks, benefits, and alternatives were explained to the patient. Questions regarding the procedure were encouraged and answered. The patient understands and consents to the procedure.  Patient was first position in prone position on CT gantry table and a CT of the abdomen pelvis was performed for planning purposes.  Safe angle approach was determined for right trans gluteal access to pelvic abscess, and the patient is prepped and draped in the usual sterile fashion.  The right gluteal region was prepped with chlorhexidinein a sterile fashion, and a sterile drape was applied covering the operative field. A sterile gown and sterile gloves were used for the procedure. Local anesthesia was provided with 1% Lidocaine.  Once the patient is prepped and draped sterilely,  1% lidocaine was used for local anesthesia. A small skin incision was made with 11 blade scalpel, and a trocar needle was advanced safely into pelvic abscess using CT guidance. We confirmed needle tip position with aspiration of purulent material and was CT imaging.  Using modified Seldinger technique, a 10 French pigtail catheter drain was placed. Approximately 60 cc of highly viscous purulent fluid was aspirated. A sample sent to the lab.  The catheter pigtail was locked, and the catheter was sutured in position. A sterile bandage was placed.  The patient was then turned in supine position for drainage of a left lower quadrant abscess.  The left lower quadrant was prepped with chlorhexidinein a sterile fashion, and a sterile drape was applied covering the operative field. A sterile gown and sterile gloves were used for the procedure. Local anesthesia was provided with 1%  Lidocaine.  Once the patient is prepped and draped sterilely, 1% lidocaine was used for local anesthesia. A small stab incision was made with 11 blade scalpel, and a trocar needle was advanced with CT guidance into fluid collection of the left lower quadrant. We confirmed position with aspiration of fluid and with CT imaging.  Using modified Seldinger technique, a 10 French pigtail catheter drain was placed into the left lower quadrant fluid collection. Approximately 50 cc of turbid thin fluid was aspirated. Drainage bag was placed. Catheter was sutured in position and a sterile dressing was placed.  The patient tolerated the procedure well and remained hemodynamically stable throughout.  No complications were encountered and no significant blood loss was encountered.  FINDINGS: Initial CT imaging in the prone position demonstrates abscess within the pelvis, similar in size and location to prior cross-sectional imaging.  Images during the case demonstrate safe placement of 10 French catheter into pelvic abscess. 60 cc of viscous purulent fluid was removed.  In knee supine position, CT image demonstrates fluid collection of the left lower quadrant (images stored in PACs are mislabeled and reversed).  After placement of 10 French catheter, there is significant decompression of this fluid collection. 50 cc of turbid thin fluid removed.  IMPRESSION: Status post placement of right trans gluteal 10 French pigtail catheter into pelvic abscess, as well as percutaneous 10 French catheter placement into left lower quadrant fluid collection. Sample was sent from each collection to the lab for analysis.  Signed,  Dulcy Fanny. Earleen Newport, DO  [facility]  [facility]   Electronically Signed   By: Corrie Mckusick D.O.   On: 02/15/2015 15:55   Medications: I have reviewed the patient's current medications. Scheduled Meds: . antiseptic oral rinse  7 mL Mouth Rinse BID  .  HYDROmorphone  (DILAUDID) injection  2 mg Intravenous Q4H  . [START ON 02/17/2015] Influenza vac split quadrivalent PF  0.5 mL Intramuscular Tomorrow-1000  . [START ON 02/17/2015] methylPREDNISolone (SOLU-MEDROL) injection  60 mg Intravenous Q24H  . piperacillin-tazobactam (ZOSYN)  IV  3.375 g Intravenous 3 times per day   Continuous Infusions: . lactated ringers 1 application (07/86/75 4492)   PRN Meds:.acetaminophen, promethazine Assessment/Plan: Principal Problem:   Intra-abdominal abscess Active Problems:   Crohn's colitis   Long-term current use of methadone for opiate dependence   Partial small bowel obstruction   Malnutrition of moderate degree   Microcytic anemia   History of elevated glucose  Intraabdominal abscesses with partial SBO 2/2 Crohn's Disease: s/p percutaneous drainage x2 with drainage catheters in place. Patient has been afebrile since having abscesses drained. BP has  been soft in the 75-449E systolic but patient reports this is her usual. She is asymptomatic at this time. Will continue to give pain medications unless she becomes symptomatic from hypotension. Continue bowel rest and NG suction until having BMs. Tapering steroids.  - Decrease LR to 100 mL/hr - Continue Zosyn per pharmacy - Phenergan 12.5 mg IV q4hr prn - Dilaudid 2 mg IV q4hr - Blood cultures pending (after Cipro given in ED) - Ttylenol 650 mg rectal q6hr prn fever - Appreciate GI's recommendations - Appreciate Surgery Consult - Appreicate IR Consult - CBC in AM - Phos 3.2 - Solumedrol 60 mg IV today, will taper by 10 mg/day starting tomorrow  Moderate Malnutrition: Albumin 2.8 on admission - Prealbumin 20.5   History of elevated glucose: Elevated BGs on previous admission in June with ranging from 107-273. No history of DM. - A1c pending  Methadone Dependence: Patient on Methadone 43 mg PO daily for chronic abdominal pain 2/2 Crohn's Disease. Will use Dilaudid to manage her pain as inpatient and transition  back to Methadone after resuming PO intake.   - Dilaudid 2 gm IV q4hr   Microcytic anemia: Fe 5, TIBC 360, % Sat 1%, Ferritin 7 - Will hold off on Fe supplementation in the setting of acute infection - Consider Fe replacement at discharge - Type and Cross  FEN: NPO with NGT for gastric decompression  DVT PPx: SCD  Dispo: Disposition is deferred at this time, awaiting improvement of current medical problems.  Anticipated discharge in approximately 1-3 day(s).   The patient does not have a current PCP (No Pcp Per Patient) and does need an [facility] hospital follow-up appointment after discharge.  The patient does not have transportation limitations that hinder transportation to clinic appointments.  .Services Needed at time of discharge: Y = Yes, Blank = No PT:   OT:   RN:   Equipment:   Other:     LOS: 1 day   Maryellen Pile, MD 02/16/2015, 3:12 PM

## 2015-02-16 NOTE — Progress Notes (Signed)
Utilization Review Completed.Krystal [name] T9/04/2015  

## 2015-02-17 LAB — CBC
HCT: 22.4 % — ABNORMAL LOW (ref 36.0–46.0)
HCT: 25.2 % — ABNORMAL LOW (ref 36.0–46.0)
Hemoglobin: 6.3 g/dL — CL (ref 12.0–15.0)
Hemoglobin: 7.6 g/dL — ABNORMAL LOW (ref 12.0–15.0)
MCH: 20.2 pg — AB (ref 26.0–34.0)
MCH: 22.2 pg — ABNORMAL LOW (ref 26.0–34.0)
MCHC: 28.1 g/dL — ABNORMAL LOW (ref 30.0–36.0)
MCHC: 30.2 g/dL (ref 30.0–36.0)
MCV: 71.8 fL — AB (ref 78.0–100.0)
MCV: 73.5 fL — AB (ref 78.0–100.0)
PLATELETS: 369 10*3/uL (ref 150–400)
PLATELETS: 319 10*3/uL (ref 150–400)
RBC: 3.12 MIL/uL — ABNORMAL LOW (ref 3.87–5.11)
RBC: 3.43 MIL/uL — AB (ref 3.87–5.11)
RDW: 16.6 % — AB (ref 11.5–15.5)
RDW: 17 % — AB (ref 11.5–15.5)
WBC: 19.7 10*3/uL — AB (ref 4.0–10.5)
WBC: 19 10*3/uL — AB (ref 4.0–10.5)

## 2015-02-17 LAB — PREPARE RBC (CROSSMATCH)

## 2015-02-17 LAB — HEMOGLOBIN A1C
Hgb A1c MFr Bld: 5.6 % (ref 4.8–5.6)
Mean Plasma Glucose: 114 mg/dL

## 2015-02-17 MED ORDER — METHYLPREDNISOLONE SODIUM SUCC 40 MG IJ SOLR
40.0000 mg | INTRAMUSCULAR | Status: DC
  Administered 2015-02-18: 40 mg via INTRAVENOUS
  Filled 2015-02-17: qty 1

## 2015-02-17 MED ORDER — SODIUM CHLORIDE 0.9 % IJ SOLN
10.0000 mL | INTRAMUSCULAR | Status: DC | PRN
  Administered 2015-02-18 – 2015-02-19 (×2): 10 mL
  Filled 2015-02-17 (×2): qty 40

## 2015-02-17 MED ORDER — SODIUM CHLORIDE 0.9 % IV SOLN
Freq: Once | INTRAVENOUS | Status: AC
  Administered 2015-02-17: 1 mL via INTRAVENOUS

## 2015-02-17 MED ORDER — SODIUM CHLORIDE 0.9 % IV SOLN
510.0000 mg | INTRAVENOUS | Status: DC
  Administered 2015-02-18: 510 mg via INTRAVENOUS
  Filled 2015-02-17: qty 17

## 2015-02-17 MED ORDER — HYDROMORPHONE HCL 1 MG/ML IJ SOLN
1.0000 mg | Freq: Once | INTRAMUSCULAR | Status: AC
  Administered 2015-02-17: 1 mg via INTRAVENOUS
  Filled 2015-02-17: qty 1

## 2015-02-17 MED ORDER — ENOXAPARIN SODIUM 40 MG/0.4ML ~~LOC~~ SOLN
40.0000 mg | SUBCUTANEOUS | Status: DC
  Administered 2015-02-17 – 2015-02-19 (×3): 40 mg via SUBCUTANEOUS
  Filled 2015-02-17 (×3): qty 0.4

## 2015-02-17 NOTE — Progress Notes (Signed)
Subjective: Patient reports feeling better this morning. Pain much improved. Has been unable to have a BM but is passing flatus. Reports tenesmus. No N/V.   Objective: Vital signs in last 24 hours: Filed Vitals:   02/17/15 0406 02/17/15 0610 02/17/15 0627 02/17/15 0900  BP:  131/52 133/65 111/57  Pulse:  59 74 75  Temp:  98.7 F (37.1 C) 98.7 F (37.1 C) 97.8 F (36.6 C)  TempSrc:  Oral Oral Oral  Resp:  16 16 16   Height:      Weight: 116 lb 13.5 oz (53 kg)     SpO2:  94%  99%   Weight change: -14.1 oz (-0.4 kg)  Intake/Output Summary (Last 24 hours) at 02/17/15 1614 Last data filed at 02/17/15 1600  Gross per 24 hour  Intake 1849.17 ml  Output   1905 ml  Net -55.83 ml   Physical Exam GENERAL- alert, co-operative, appears as stated age, in distress HEENT- Atraumatic, normocephalic, PERRL, EOMI, oral mucosa appears moist CARDIAC- RRR, no murmurs, rubs or gallops. RESP- Moving equal volumes of air, and clear to auscultation bilaterally, no wheezes or crackles. ABDOMEN- Abdomen is soft and non-tender, no guarding or rebound. Normal bowel sounds.  NEURO- No obvious Cr N abnormality, strenght upper and lower extremities- 5/5, Sensation intact- globally EXTREMITIES- pulse 2+, symmetric, no pedal edema. SKIN- Warm, dry, No rash or lesion. PSYCH- Normal mood and affect, appropriate thought content and speech.  Lab Results: Basic Metabolic Panel:  Recent Labs Lab 02/15/15 0315 02/15/15 0925 02/16/15 0407  NA 139  --  138  K 4.1  --  3.9  CL 104  --  105  CO2 25  --  27  GLUCOSE 136*  --  148*  BUN 11  --  9  CREATININE 0.80  --  0.75  CALCIUM 8.8*  --  8.8*  MG  --  1.6* 2.1  PHOS  --  3.2  --    Liver Function Tests:  Recent Labs Lab 02/15/15 0315  AST 15  ALT 10*  ALKPHOS 57  BILITOT 0.3  PROT 6.3*  ALBUMIN 2.8*    Recent Labs Lab 02/15/15 0315  LIPASE 15*   No results for input(s): AMMONIA in the last 168 hours. CBC:  Recent Labs Lab  02/15/15 0925 02/16/15 0407 02/17/15 0330  WBC 32.9* 31.4* 19.7*  NEUTROABS 27.7*  --   --   HGB 7.7* 7.1* 6.3*  HCT 26.3* 23.9* 22.4*  MCV 71.9* 72.4* 71.8*  PLT 353 328 369   Cardiac Enzymes: No results for input(s): CKTOTAL, CKMB, CKMBINDEX, TROPONINI in the last 168 hours. BNP: No results for input(s): PROBNP in the last 168 hours. D-Dimer: No results for input(s): DDIMER in the last 168 hours. CBG: No results for input(s): GLUCAP in the last 168 hours. Hemoglobin A1C:  Recent Labs Lab 02/15/15 0925  HGBA1C 5.6   Fasting Lipid Panel: No results for input(s): CHOL, HDL, LDLCALC, TRIG, CHOLHDL, LDLDIRECT in the last 168 hours. Thyroid Function Tests: No results for input(s): TSH, T4TOTAL, FREET4, T3FREE, THYROIDAB in the last 168 hours. Coagulation:  Recent Labs Lab 02/15/15 0925  LABPROT 14.9  INR 1.15   Anemia Panel:  Recent Labs Lab 02/15/15 0925  FERRITIN 7*  TIBC 360  IRON 5*  RETICCTPCT 2.7   Urine Drug Screen: Drugs of Abuse     Component Value Date/Time   LABOPIA RESULTS UNAVAILABLE DUE TO INTERFERING SUBSTANCE* 11/14/2014 [place] DETECTED 11/14/2014 0762  LABBENZ NONE DETECTED 11/14/2014 0853   AMPHETMU RESULTS UNAVAILABLE DUE TO INTERFERING SUBSTANCE* 11/14/2014 0853   THCU RESULTS UNAVAILABLE DUE TO INTERFERING SUBSTANCE* 11/14/2014 0853   LABBARB NONE DETECTED 11/14/2014 0853    Alcohol Level: No results for input(s): ETH in the last 168 hours. Urinalysis:  Recent Labs Lab 02/15/15 0409  COLORURINE YELLOW  LABSPEC 1.019  PHURINE 5.5  GLUCOSEU NEGATIVE  HGBUR NEGATIVE  BILIRUBINUR NEGATIVE  KETONESUR NEGATIVE  PROTEINUR NEGATIVE  UROBILINOGEN 0.2  NITRITE NEGATIVE  LEUKOCYTESUR NEGATIVE   Micro Results: Recent Results (from the past 240 hour(s))  Culture, blood (routine x 2)     Status: None (Preliminary result)   Collection Time: 02/15/15  9:35 AM  Result Value Ref Range Status   Specimen Description  BLOOD LEFT ANTECUBITAL  Final   Special Requests   Final    BOTTLES DRAWN AEROBIC AND ANAEROBIC 10CC AER 5CC ANA   Culture NO GROWTH 2 DAYS  Final   Report Status PENDING  Incomplete  Culture, blood (routine x 2)     Status: None (Preliminary result)   Collection Time: 02/15/15  9:40 AM  Result Value Ref Range Status   Specimen Description BLOOD LEFT HAND  Final   Special Requests BOTTLES DRAWN AEROBIC ONLY 10CC  Final   Culture NO GROWTH 2 DAYS  Final   Report Status PENDING  Incomplete  Culture, routine-abscess     Status: None (Preliminary result)   Collection Time: 02/15/15  5:39 PM  Result Value Ref Range Status   Specimen Description ABSCESS PELVIS  Final   Special Requests Normal  Final   Gram Stain   Final    MODERATE WBC PRESENT,BOTH PMN AND MONONUCLEAR NO SQUAMOUS EPITHELIAL CELLS SEEN MODERATE GRAM POSITIVE COCCI IN CHAINS Performed at [facility]    Culture   Final    Culture reincubated for better growth Performed at [facility]    Report Status PENDING  Incomplete  Culture, routine-abscess     Status: None (Preliminary result)   Collection Time: 02/15/15  5:40 PM  Result Value Ref Range Status   Specimen Description ABSCESS LEFT LOWER ABDOMEN  Final   Special Requests Normal  Final   Gram Stain   Final    MODERATE WBC PRESENT, PREDOMINANTLY PMN NO SQUAMOUS EPITHELIAL CELLS SEEN NO ORGANISMS SEEN Performed at [facility]    Culture   Final    NO GROWTH 1 DAY Performed at [facility]    Report Status PENDING  Incomplete   Studies/Results: No results found. Medications: I have reviewed the patient's current medications. Scheduled Meds: . antiseptic oral rinse  7 mL Mouth Rinse BID  . [START ON 02/18/2015] ferumoxytol  510 mg Intravenous Weekly  .  HYDROmorphone (DILAUDID) injection  2 mg Intravenous Q4H  . Influenza vac split quadrivalent PF  0.5 mL Intramuscular Tomorrow-1000  . methylPREDNISolone (SOLU-MEDROL)  injection  50 mg Intravenous Q24H  . piperacillin-tazobactam (ZOSYN)  IV  3.375 g Intravenous 3 times per day   Continuous Infusions: . lactated ringers 75 mL/hr at 02/17/15 1043   PRN Meds:.acetaminophen, phenol, promethazine, sodium chloride Assessment/Plan: Principal Problem:   Intra-abdominal abscess Active Problems:   Crohn's colitis   Long-term current use of methadone for opiate dependence   Partial small bowel obstruction   Malnutrition of moderate degree   Iron deficiency anemia   History of elevated glucose  Intraabdominal abscesses with partial SBO 2/2 Crohn's Disease: s/p percutaneous drainage x2 with drainage  catheters in place. Patient has been afebrile since having abscesses drained. BP has been soft in the 97-673A systolic but patient reports this is her usual. She is asymptomatic at this time. Will continue to give pain medications unless she becomes symptomatic from hypotension. Continue bowel rest. Stop NG suction per surgery recs. Tapering steroids. Will need TPN. - Decrease LR to 75 mL/hr - Continue Zosyn per pharmacy - Phenergan 12.5 mg IV q4hr prn - Dilaudid 2 mg IV q4hr - Blood cultures NG x 2 days(after Cipro given in ED) - Abscess cultures with G+ cocci in chains, likely enterococcus. Continuing ABX.  - Appreciate Surgery Consult - CBC in AM - Solumedrol 50 mg IV today, will taper by 10 mg/day starting tomorrow  Moderate Malnutrition: Albumin 2.8 on admission - Prealbumin 20.5  - Will need TPN, they are waiting until day 5 of NPO to start TPN per protocol.   History of elevated glucose: Elevated BGs on previous admission in June with ranging from 107-273. No history of DM. - A1c 5.6.  Methadone Dependence: Patient on Methadone 43 mg PO daily for chronic abdominal pain 2/2 Crohn's Disease. Will use Dilaudid to manage her pain as inpatient and transition back to Methadone after resuming PO intake.  - Dilaudid 2 gm IV q4hr   Microcytic anemia: Fe 5,  TIBC 360, % Sat 1%, Ferritin 7. Patient's H/H dropped to 6.3/22.4 this morning.  - IV Fe tomorrow - s/p 1 unit PRBC this morning.   FEN: NPO  DVT PPx: SCD  Dispo: Disposition is deferred at this time, awaiting improvement of current medical problems.  Anticipated discharge in approximately 1-3 day(s).   The patient does not have a current PCP (No Pcp Per Patient) and does need an [facility] hospital follow-up appointment after discharge.  The patient does have transportation limitations that hinder transportation to clinic appointments.  .Services Needed at time of discharge: Y = Yes, Blank = No PT:   OT:   RN:   Equipment:   Other:     LOS: 2 days   Maryellen Pile, MD IMTS PGY-1 [phone] 02/17/2015, 4:14 PM

## 2015-02-17 NOTE — Progress Notes (Signed)
Patient ID: Krystal [name], female   DOB: 03-Apr-1961, 54 y.o.   MRN: [id number]    Subjective: Feels better today.  Passing some flatus and feels like she needs to have a BM.  No nausea with NGT clamped.  On 300cc out yesterday  Objective: Vital signs in last 24 hours: Temp:  [97.8 F (36.6 C)-98.7 F (37.1 C)] 98.7 F (37.1 C) (09/12 0627) Pulse Rate:  [59-74] 74 (09/12 0627) Resp:  [16-18] 16 (09/12 0627) BP: (85-133)/(45-65) 133/65 mmHg (09/12 0627) SpO2:  [94 %-98 %] 94 % (09/12 0610) Weight:  [53 kg (116 lb 13.5 oz)] 53 kg (116 lb 13.5 oz) (09/12 0406) Last BM Date: 02/14/15  Intake/Output from previous day: 09/11 0701 - 09/12 0700 In: 2978.8 [I.V.:2543.8; Blood:335; IV Piggyback:100] Out: 1680 [Urine:1250; Emesis/NG output:300; Drains:130] Intake/Output this shift:    PE: Abd: soft, still tender, right drain with dark brown bloody output.  Left drain with no output right now, but tan output in tubing Heart: regular Lungs: CTAB  Lab Results:   Recent Labs  02/16/15 0407 02/17/15 0330  WBC 31.4* 19.7*  HGB 7.1* 6.3*  HCT 23.9* 22.4*  PLT 328 369   BMET  Recent Labs  02/15/15 0315 02/16/15 0407  NA 139 138  K 4.1 3.9  CL 104 105  CO2 25 27  GLUCOSE 136* 148*  BUN 11 9  CREATININE 0.80 0.75  CALCIUM 8.8* 8.8*   PT/INR  Recent Labs  02/15/15 0925  LABPROT 14.9  INR 1.15   CMP     Component Value Date/Time   NA 138 02/16/2015 0407   K 3.9 02/16/2015 0407   CL 105 02/16/2015 0407   CO2 27 02/16/2015 0407   GLUCOSE 148* 02/16/2015 0407   BUN 9 02/16/2015 0407   CREATININE 0.75 02/16/2015 0407   CALCIUM 8.8* 02/16/2015 0407   PROT 6.3* 02/15/2015 0315   ALBUMIN 2.8* 02/15/2015 0315   AST 15 02/15/2015 0315   ALT 10* 02/15/2015 0315   ALKPHOS 57 02/15/2015 0315   BILITOT 0.3 02/15/2015 0315   GFRNONAA >60 02/16/2015 0407   GFRAA >60 02/16/2015 0407   Lipase     Component Value Date/Time   LIPASE 15* 02/15/2015 0315        Studies/Results: Ct Image Guided Fluid Drain By Catheter  02/15/2015   CLINICAL DATA:  54 year old female with a history of Crohn's disease, presents with multiloculated abscess of the abdomen pelvis.  She has been referred for evaluation for drain placement.  EXAM: CT GUIDED ABSCESS DRAIN BIOPSY OF PELVIC ABSCESS AND LEFT LOWER QUADRANT ABDOMINAL ABSCESS  ANESTHESIA/SEDATION: 3.0  Mg IV Versed; 150 mcg IV Fentanyl  Total Moderate Sedation Time: 45 minutes.  PROCEDURE: The procedure risks, benefits, and alternatives were explained to the patient. Questions regarding the procedure were encouraged and answered. The patient understands and consents to the procedure.  Patient was first position in prone position on CT gantry table and a CT of the abdomen pelvis was performed for planning purposes.  Safe angle approach was determined for right trans gluteal access to pelvic abscess, and the patient is prepped and draped in the usual sterile fashion.  The right gluteal region was prepped with chlorhexidinein a sterile fashion, and a sterile drape was applied covering the operative field. A sterile gown and sterile gloves were used for the procedure. Local anesthesia was provided with 1% Lidocaine.  Once the patient is prepped and draped sterilely, 1% lidocaine was used for local anesthesia.  A small skin incision was made with 11 blade scalpel, and a trocar needle was advanced safely into pelvic abscess using CT guidance. We confirmed needle tip position with aspiration of purulent material and was CT imaging.  Using modified Seldinger technique, a 10 French pigtail catheter drain was placed. Approximately 60 cc of highly viscous purulent fluid was aspirated. A sample sent to the lab.  The catheter pigtail was locked, and the catheter was sutured in position. A sterile bandage was placed.  The patient was then turned in supine position for drainage of a left lower quadrant abscess.  The left lower quadrant  was prepped with chlorhexidinein a sterile fashion, and a sterile drape was applied covering the operative field. A sterile gown and sterile gloves were used for the procedure. Local anesthesia was provided with 1% Lidocaine.  Once the patient is prepped and draped sterilely, 1% lidocaine was used for local anesthesia. A small stab incision was made with 11 blade scalpel, and a trocar needle was advanced with CT guidance into fluid collection of the left lower quadrant. We confirmed position with aspiration of fluid and with CT imaging.  Using modified Seldinger technique, a 10 French pigtail catheter drain was placed into the left lower quadrant fluid collection. Approximately 50 cc of turbid thin fluid was aspirated. Drainage bag was placed. Catheter was sutured in position and a sterile dressing was placed.  The patient tolerated the procedure well and remained hemodynamically stable throughout.  No complications were encountered and no significant blood loss was encountered.  FINDINGS: Initial CT imaging in the prone position demonstrates abscess within the pelvis, similar in size and location to prior cross-sectional imaging.  Images during the case demonstrate safe placement of 10 French catheter into pelvic abscess. 60 cc of viscous purulent fluid was removed.  In knee supine position, CT image demonstrates fluid collection of the left lower quadrant (images stored in PACs are mislabeled and reversed).  After placement of 10 French catheter, there is significant decompression of this fluid collection. 50 cc of turbid thin fluid removed.  IMPRESSION: Status post placement of right trans gluteal 10 French pigtail catheter into pelvic abscess, as well as percutaneous 10 French catheter placement into left lower quadrant fluid collection. Sample was sent from each collection to the lab for analysis.  Signed,  Dulcy Fanny. Earleen Newport, DO  [facility]  [facility]    Electronically Signed   By: Corrie Mckusick D.O.   On: 02/15/2015 15:55    Anti-infectives: Anti-infectives    Start     Dose/Rate Route Frequency Ordered Stop   02/15/15 2130  ciprofloxacin (CIPRO) IVPB 400 mg  Status:  Discontinued     400 mg 200 mL/hr over 60 Minutes Intravenous Every 12 hours 02/15/15 0920 02/15/15 1117   02/15/15 1400  piperacillin-tazobactam (ZOSYN) IVPB 3.375 g     3.375 g 12.5 mL/hr over 240 Minutes Intravenous 3 times per day 02/15/15 1200     02/15/15 0745  ciprofloxacin (CIPRO) IVPB 400 mg     400 mg 200 mL/hr over 60 Minutes Intravenous  Once 02/15/15 0736 02/15/15 1109   02/15/15 0745  metroNIDAZOLE (FLAGYL) IVPB 500 mg  Status:  Discontinued     500 mg 100 mL/hr over 60 Minutes Intravenous  Once 02/15/15 0736 02/15/15 1117       Assessment/Plan  1. Crohn's disease with multiple intra-abdominal fluid collections and EE fistulas  -cont with perc drains to help resolve these abscesses -she  can significant inflammation and infection as this time.  Ideally, we would like for this to improve before considering an operation as she would be high risk for complications right now. -cont NPO, but DC NGT -will insert PICC and start her on TNA due to inability to take in oral food for a while -cont abx therapy Anemia, likely of chronic disease -s/p 1 unit RBCs today DVT Prophylaxis -SCDs only due to anemia  LOS: 2 days    Cosima Prentiss E 02/17/2015, 9:32 AM Pager: [phone]

## 2015-02-17 NOTE — Progress Notes (Addendum)
Initial Nutrition Assessment  DOCUMENTATION CODES:   Pt meets criteria for MODERATE MALNUTRITION in the context of chronic illness as evidenced by mild fat depletion and intake < 75% of her needs for >/= 1 month.  INTERVENTION:   TPN per Pharmacy  If diet advanced to clear liquids will order Boost Breeze TID (pt's preference)  NUTRITION DIAGNOSIS:   Inadequate oral intake related to altered GI function as evidenced by NPO status.  GOAL:   Patient will meet greater than or equal to 90% of their needs  MONITOR:   PO intake, Supplement acceptance, Diet advancement, I & O's  REASON FOR ASSESSMENT:   Consult New TPN/TNA  ASSESSMENT:   Pt with hx of Crohn's disease with multiple intra-abdominal fluid collections and EE fistulas, perc drains in place for abscess.    Pt NPO with NGT but tolerating clamping, plan for NGT to be removed today.  Per MD note pt is not ready for surgery and needs to allow current abscess/inflamatory process to improve.   Per pt she has gained some weight recently at home after being hospitalized in June 2016. Her appetite has been ok, however reviewing what she consumes she is likely not getting in enough.  Breakfast: rice chex Lunch: [place] sandwich Dinner: rice chex (3-4 days a week) Other days it's chicken and rice or pasta She likes Boost Breeze and usually consumes 1 per day.  We discussed importance of adequate kcal and protein especially prior to surgery. Pt appreciative of information.   Pt awaits PICC placement, unclear at this point if/when her diet will be advanced.   Diet Order:  Diet NPO time specified  Skin:  Reviewed, no issues  Last BM:  9/9  Height:   Ht Readings from Last 1 Encounters:  02/16/15 5\' 2"  (1.575 m)    Weight:   Wt Readings from Last 1 Encounters:  02/17/15 116 lb 13.5 oz (53 kg)    Ideal Body Weight:  50 kg  BMI:  Body mass index is 21.37 kg/(m^2).  Estimated Nutritional Needs:   Kcal:   1500-1700  Protein:  75-90 grams  Fluid:  > 1.5 L/day  EDUCATION NEEDS:   No education needs identified at this time  [place], [place], [place] Pager [phone] After Hours Pager

## 2015-02-17 NOTE — Progress Notes (Addendum)
[place] NOTE  Pharmacy Consult for TPN Indication: SBO and Prolonged NPO status  Allergies  Allergen Reactions  . Sulfa Antibiotics Rash    Patient Measurements: Height: 5\' 2"  (157.5 cm) Weight: 116 lb 13.5 oz (53 kg) IBW/kg (Calculated) : 50.1  Vital Signs: Temp: 98.7 F (37.1 C) (09/12 0627) Temp Source: Oral (09/12 0627) BP: 133/65 mmHg (09/12 0627) Pulse Rate: 74 (09/12 0627) Intake/Output from previous day: 09/11 0701 - 09/12 0700 In: 2978.8 [I.V.:2543.8; Blood:335; IV Piggyback:100] Out: 1680 [Urine:1250; Emesis/NG output:300; Drains:130] Intake/Output from this shift:    Labs:  Recent Labs  02/15/15 0925 02/16/15 0407 02/17/15 0330  WBC 32.9* 31.4* 19.7*  HGB 7.7* 7.1* 6.3*  HCT 26.3* 23.9* 22.4*  PLT 353 328 369  APTT 26  --   --   INR 1.15  --   --      Recent Labs  02/15/15 0315 02/15/15 0925 02/16/15 0407  NA 139  --  138  K 4.1  --  3.9  CL 104  --  105  CO2 25  --  27  GLUCOSE 136*  --  148*  BUN 11  --  9  CREATININE 0.80  --  0.75  CALCIUM 8.8*  --  8.8*  MG  --  1.6* 2.1  PHOS  --  3.2  --   PROT 6.3*  --   --   ALBUMIN 2.8*  --   --   AST 15  --   --   ALT 10*  --   --   ALKPHOS 57  --   --   BILITOT 0.3  --   --   PREALBUMIN  --  20.5  --    Estimated Creatinine Clearance: 65.1 mL/min (by C-G formula based on Cr of 0.75).   No results for input(s): GLUCAP in the last 72 hours.    Insulin Requirements in the past 24 hours:  None  Nutritional Goals:  RD consulted  Current Nutrition:  NPO  Assessment: 54 YO female presents w/ LLQ pain, nausea, and abd distension. Hx of Crohn's and bowel obstruction. Zosyn was started for R pelvic abscesses. Pt reports no issues up until the night of 9/9 when she started having abdominal pain. Pharnacy consulted to start TPN for SBO and presumed prolonged NPO status. Since patient has only had poor diet for a few days will plan to wait till at least day 5 (9/13) of  being NPO before starting TPN. Also patient needs a PICC line placed and are holding off on that until Blood cx's negative for at least 48 hours (drawn ~0940 on 9/10). Surgery does plan to take to surgery but is waiting days to weeks for current abscess/inflammatory process to improve.  Surgeries/Procedures: pending  GI: Hx of Crohn's disease. Abdominal pain has greatly improved over last couple days. No nausea with NGT clamped. Passing some flatus and feels like she needs to have a BM. Right drain with dark bloody output. Albumin low at 2.8.    Endo: No hx of DM. AM glucose ok. methylpred  Lytes: Lytes wnl. Phos and Mg ok   Renal: SCr at baseline 0.75, CrCl ~60 ml/min. MIVF: lactated ringers @ 125ml/hr.   Pulm: RA  Cards: BP soft and HR ok. No CV meds  Hepatobil: LFTs wnl  Neuro: dilaudid prn  ID: Day #3 of zosyn for crohn's colits with right pelvic abscesses. s/p drainage and drain placement of abscesses. Afebrile, WBC elevated at 19.7,  but down from 31.4 yesterday (on steroids).  9/10 Blood cx @ ~0940: NGTD  9/10 Zosyn >>  Best Practices: SCDs  TPN Access: PICC ordered but not placed- see below  TPN start date: plans for 9/13  Plan:  -Will wait until day #5 (9/13) of NPO to start TPN as per protocol- discussed with Claiborne Billings from surgery and she agrees -Also awaiting PICC line placement after blood cx's negative for at least 48 hours- can be placed today as long as updated Bcx remain negative (last updated yesterday afternoon- drawn 9/10 ~0940). -TPN labs tomorrow morning -F/U RD recommendations  Jeralynn Vaquera D. Handy Mcloud, PharmD, BCPS Clinical Pharmacist Pager: [phone] 02/17/2015 9:47 AM

## 2015-02-17 NOTE — Progress Notes (Signed)
Pt hgb 6.3 on call MD Dr. Lovena Le ordered 1 unit of PRBC started at 0627 verified by another nurse no blood transfusion reaction at this time, will continue to monitor , consent signed.

## 2015-02-17 NOTE — Progress Notes (Signed)
Peripherally Inserted Central Catheter/Midline Placement  The IV Nurse has discussed with the patient and/or persons authorized to consent for the patient, the purpose of this procedure and the potential benefits and risks involved with this procedure.  The benefits include less needle sticks, lab draws from the catheter and patient may be discharged home with the catheter.  Risks include, but not limited to, infection, bleeding, blood clot (thrombus formation), and puncture of an artery; nerve damage and irregular heat beat.  Alternatives to this procedure were also discussed.  Consent obtained by Christella Noa, RN   PICC/Midline Placement Documentation        Ragan Duhon, Nicolette Bang 02/17/2015, 2:26 PM

## 2015-02-18 LAB — CBC
HCT: 26.7 % — ABNORMAL LOW (ref 36.0–46.0)
Hemoglobin: 8.2 g/dL — ABNORMAL LOW (ref 12.0–15.0)
MCH: 22.6 pg — AB (ref 26.0–34.0)
MCHC: 30.7 g/dL (ref 30.0–36.0)
MCV: 73.6 fL — AB (ref 78.0–100.0)
PLATELETS: 404 10*3/uL — AB (ref 150–400)
RBC: 3.63 MIL/uL — ABNORMAL LOW (ref 3.87–5.11)
RDW: 17.2 % — AB (ref 11.5–15.5)
WBC: 20 10*3/uL — ABNORMAL HIGH (ref 4.0–10.5)

## 2015-02-18 LAB — COMPREHENSIVE METABOLIC PANEL
ALT: 10 U/L — AB (ref 14–54)
AST: 13 U/L — AB (ref 15–41)
Albumin: 2.3 g/dL — ABNORMAL LOW (ref 3.5–5.0)
Alkaline Phosphatase: 51 U/L (ref 38–126)
Anion gap: 7 (ref 5–15)
BILIRUBIN TOTAL: 0.4 mg/dL (ref 0.3–1.2)
BUN: 17 mg/dL (ref 6–20)
CO2: 28 mmol/L (ref 22–32)
CREATININE: 0.95 mg/dL (ref 0.44–1.00)
Calcium: 9.1 mg/dL (ref 8.9–10.3)
Chloride: 108 mmol/L (ref 101–111)
Glucose, Bld: 91 mg/dL (ref 65–99)
Potassium: 3.4 mmol/L — ABNORMAL LOW (ref 3.5–5.1)
Sodium: 143 mmol/L (ref 135–145)
TOTAL PROTEIN: 5.8 g/dL — AB (ref 6.5–8.1)

## 2015-02-18 LAB — DIFFERENTIAL
BASOS PCT: 0 % (ref 0–1)
Basophils Absolute: 0 10*3/uL (ref 0.0–0.1)
EOS ABS: 0 10*3/uL (ref 0.0–0.7)
Eosinophils Relative: 0 % (ref 0–5)
LYMPHS ABS: 2 10*3/uL (ref 0.7–4.0)
Lymphocytes Relative: 10 % — ABNORMAL LOW (ref 12–46)
MONO ABS: 1.2 10*3/uL — AB (ref 0.1–1.0)
Monocytes Relative: 6 % (ref 3–12)
NEUTROS ABS: 16.8 10*3/uL — AB (ref 1.7–7.7)
Neutrophils Relative %: 84 % — ABNORMAL HIGH (ref 43–77)

## 2015-02-18 LAB — PHOSPHORUS: Phosphorus: 4.1 mg/dL (ref 2.5–4.6)

## 2015-02-18 LAB — TYPE AND SCREEN
ABO/RH(D): A POS
ANTIBODY SCREEN: NEGATIVE
UNIT DIVISION: 0

## 2015-02-18 LAB — CULTURE, ROUTINE-ABSCESS: Special Requests: NORMAL

## 2015-02-18 LAB — GLUCOSE, CAPILLARY
GLUCOSE-CAPILLARY: 130 mg/dL — AB (ref 65–99)
GLUCOSE-CAPILLARY: 154 mg/dL — AB (ref 65–99)
Glucose-Capillary: 138 mg/dL — ABNORMAL HIGH (ref 65–99)

## 2015-02-18 LAB — MAGNESIUM: Magnesium: 2 mg/dL (ref 1.7–2.4)

## 2015-02-18 LAB — OCCULT BLOOD X 1 CARD TO LAB, STOOL: FECAL OCCULT BLD: POSITIVE — AB

## 2015-02-18 LAB — PREALBUMIN: PREALBUMIN: 13.7 mg/dL — AB (ref 18–38)

## 2015-02-18 LAB — TRIGLYCERIDES: TRIGLYCERIDES: 205 mg/dL — AB (ref <150)

## 2015-02-18 MED ORDER — FAT EMULSION 20 % IV EMUL
240.0000 mL | INTRAVENOUS | Status: AC
  Administered 2015-02-18: 240 mL via INTRAVENOUS
  Filled 2015-02-18: qty 250

## 2015-02-18 MED ORDER — BOOST / RESOURCE BREEZE PO LIQD
1.0000 | Freq: Three times a day (TID) | ORAL | Status: DC
  Administered 2015-02-18 – 2015-02-19 (×5): 1 via ORAL
  Administered 2015-02-19: 12:00:00 via ORAL
  Administered 2015-02-20: 1 via ORAL

## 2015-02-18 MED ORDER — SACCHAROMYCES BOULARDII 250 MG PO CAPS
250.0000 mg | ORAL_CAPSULE | Freq: Two times a day (BID) | ORAL | Status: DC
  Administered 2015-02-18 – 2015-02-20 (×5): 250 mg via ORAL
  Filled 2015-02-18 (×5): qty 1

## 2015-02-18 MED ORDER — TRACE MINERALS CR-CU-MN-SE-ZN 10-1000-500-60 MCG/ML IV SOLN
INTRAVENOUS | Status: AC
  Administered 2015-02-18: 17:00:00 via INTRAVENOUS
  Filled 2015-02-18: qty 960

## 2015-02-18 MED ORDER — METHYLPREDNISOLONE SODIUM SUCC 40 MG IJ SOLR
30.0000 mg | Freq: Once | INTRAMUSCULAR | Status: AC
  Administered 2015-02-19: 30 mg via INTRAVENOUS
  Filled 2015-02-18: qty 1

## 2015-02-18 MED ORDER — POTASSIUM CHLORIDE 10 MEQ/50ML IV SOLN
10.0000 meq | INTRAVENOUS | Status: AC
  Administered 2015-02-18 (×4): 10 meq via INTRAVENOUS
  Filled 2015-02-18 (×4): qty 50

## 2015-02-18 MED ORDER — SODIUM CHLORIDE 0.9 % IV SOLN
INTRAVENOUS | Status: DC
  Administered 2015-02-18: 18:00:00 via INTRAVENOUS

## 2015-02-18 MED ORDER — INSULIN ASPART 100 UNIT/ML ~~LOC~~ SOLN
0.0000 [IU] | SUBCUTANEOUS | Status: AC
  Administered 2015-02-18: 2 [IU] via SUBCUTANEOUS
  Administered 2015-02-18 – 2015-02-19 (×3): 1 [IU] via SUBCUTANEOUS
  Administered 2015-02-19: 2 [IU] via SUBCUTANEOUS

## 2015-02-18 NOTE — Progress Notes (Signed)
ANTIBIOTIC CONSULT NOTE  Pharmacy Consult for Zosyn Indication: abscesses  Allergies  Allergen Reactions  . Sulfa Antibiotics Rash    Patient Measurements: Height: 5\' 2"  (157.5 cm) Weight: 116 lb 10 oz (52.9 kg) IBW/kg (Calculated) : 50.1  Vital Signs: Temp: 97.7 F (36.5 C) (09/13 0411) Temp Source: Oral (09/13 0411) BP: 113/63 mmHg (09/13 0411) Pulse Rate: 74 (09/13 0411) Intake/Output from previous day: 09/12 0701 - 09/13 0700 In: 710 [I.V.:700] Out: 1205 [Urine:1175; Drains:30] Intake/Output from this shift:    Labs:  Recent Labs  02/16/15 0407 02/17/15 0330 02/17/15 1815 02/18/15 0421  WBC 31.4* 19.7* 19.0* 20.0*  HGB 7.1* 6.3* 7.6* 8.2*  PLT 328 369 319 404*  CREATININE 0.75  --   --  0.95   Estimated Creatinine Clearance: 54.8 mL/min (by C-G formula based on Cr of 0.95). No results for input(s): VANCOTROUGH, VANCOPEAK, VANCORANDOM, GENTTROUGH, GENTPEAK, GENTRANDOM, TOBRATROUGH, TOBRAPEAK, TOBRARND, AMIKACINPEAK, AMIKACINTROU, AMIKACIN in the last 72 hours.   Microbiology: Recent Results (from the past 720 hour(s))  Culture, blood (routine x 2)     Status: None (Preliminary result)   Collection Time: 02/15/15  9:35 AM  Result Value Ref Range Status   Specimen Description BLOOD LEFT ANTECUBITAL  Final   Special Requests   Final    BOTTLES DRAWN AEROBIC AND ANAEROBIC 10CC AER 5CC ANA   Culture NO GROWTH 2 DAYS  Final   Report Status PENDING  Incomplete  Culture, blood (routine x 2)     Status: None (Preliminary result)   Collection Time: 02/15/15  9:40 AM  Result Value Ref Range Status   Specimen Description BLOOD LEFT HAND  Final   Special Requests BOTTLES DRAWN AEROBIC ONLY 10CC  Final   Culture NO GROWTH 2 DAYS  Final   Report Status PENDING  Incomplete  Culture, routine-abscess     Status: None   Collection Time: 02/15/15  5:39 PM  Result Value Ref Range Status   Specimen Description ABSCESS PELVIS  Final   Special Requests Normal  Final   Gram Stain   Final    MODERATE WBC PRESENT,BOTH PMN AND MONONUCLEAR NO SQUAMOUS EPITHELIAL CELLS SEEN MODERATE GRAM POSITIVE COCCI IN CHAINS Performed at [facility]    Culture   Final    MODERATE MICROAEROPHILIC STREPTOCOCCI Note: Standardized susceptibility testing for this organism is not available. Performed at [facility]    Report Status 02/18/2015 FINAL  Final  Culture, routine-abscess     Status: None (Preliminary result)   Collection Time: 02/15/15  5:40 PM  Result Value Ref Range Status   Specimen Description ABSCESS LEFT LOWER ABDOMEN  Final   Special Requests Normal  Final   Gram Stain   Final    MODERATE WBC PRESENT, PREDOMINANTLY PMN NO SQUAMOUS EPITHELIAL CELLS SEEN NO ORGANISMS SEEN Performed at [facility]    Culture   Final    NO GROWTH 2 DAYS Performed at [facility]    Report Status PENDING  Incomplete    Assessment: 54 YO female presents w/ LLQ pain, nausea, and abd distension. Hx of Crohn's and bowel obstruction. Pharmacy consulted for Zosyn for right pelvic abscesses which are now s/p draining and drain placement. Afebrile. WBC sig elevated at 20, but down from a few days ago. Patient is on IV steroids as well.  9/10 Blood Cx x2: NGTD 9/10 abscess: microaerophilic strep  Zosyn 1/02>>  SCr remains stable at 0.95, CrCl ~55-22ml/min.  Plan:  -Zosyn 3.375 g  q8h EI -F/u c/s, renal function, clinical progression, de-escalation/LOT, surgery plans  Shanel Prazak D. [place], PharmD, BCPS Clinical Pharmacist Pager: [phone] 02/18/2015 9:13 AM

## 2015-02-18 NOTE — Progress Notes (Signed)
Subjective: Patient reports feeling good this morning. She says she had 3-4 BM overnight. No blood or melena. Still having some abdominal soreness worse on the left side at her drain site. Otherwise doing well.   Objective: Vital signs in last 24 hours: Filed Vitals:   02/17/15 0627 02/17/15 0900 02/17/15 2232 02/18/15 0411  BP: 133/65 111/57 110/71 113/63  Pulse: 74 75 56 74  Temp: 98.7 F (37.1 C) 97.8 F (36.6 C) 98 F (36.7 C) 97.7 F (36.5 C)  TempSrc: Oral Oral Oral Oral  Resp: 16 16 17 18   Height:      Weight:    116 lb 10 oz (52.9 kg)  SpO2:  99% 100% 99%   Weight change: -3.5 oz (-0.1 kg)  Intake/Output Summary (Last 24 hours) at 02/18/15 1014 Last data filed at 02/18/15 0000  Gross per 24 hour  Intake    710 ml  Output    755 ml  Net    -45 ml   Physical Exam GENERAL- alert, co-operative, appears as stated age, in distress HEENT- Atraumatic, normocephalic, PERRL, EOMI, oral mucosa appears moist CARDIAC- RRR, no murmurs, rubs or gallops. RESP- Moving equal volumes of air, and clear to auscultation bilaterally, no wheezes or crackles.  ABDOMEN- Abdomen is soft and non-tender, no guarding or rebound. Normal bowel sounds.  NEURO- No obvious Cr N abnormality, strenght upper and lower extremities- 5/5, Sensation intact- globally EXTREMITIES- pulse 2+, symmetric, no pedal edema. SKIN- Warm, dry, No rash or lesion. PSYCH- Normal mood and affect, appropriate thought content and speech.  Lab Results: Basic Metabolic Panel:  Recent Labs Lab 02/15/15 0925 02/16/15 0407 02/18/15 0421  NA  --  138 143  K  --  3.9 3.4*  CL  --  105 108  CO2  --  27 28  GLUCOSE  --  148* 91  BUN  --  9 17  CREATININE  --  0.75 0.95  CALCIUM  --  8.8* 9.1  MG 1.6* 2.1 2.0  PHOS 3.2  --  4.1   Liver Function Tests:  Recent Labs Lab 02/15/15 0315 02/18/15 0421  AST 15 13*  ALT 10* 10*  ALKPHOS 57 51  BILITOT 0.3 0.4  PROT 6.3* 5.8*  ALBUMIN 2.8* 2.3*    Recent  Labs Lab 02/15/15 0315  LIPASE 15*   No results for input(s): AMMONIA in the last 168 hours. CBC:  Recent Labs Lab 02/15/15 0925  02/17/15 1815 02/18/15 0421  WBC 32.9*  < > 19.0* 20.0*  NEUTROABS 27.7*  --   --  16.8*  HGB 7.7*  < > 7.6* 8.2*  HCT 26.3*  < > 25.2* 26.7*  MCV 71.9*  < > 73.5* 73.6*  PLT 353  < > 319 404*  < > = values in this interval not displayed. Cardiac Enzymes: No results for input(s): CKTOTAL, CKMB, CKMBINDEX, TROPONINI in the last 168 hours. BNP: No results for input(s): PROBNP in the last 168 hours. D-Dimer: No results for input(s): DDIMER in the last 168 hours. CBG: No results for input(s): GLUCAP in the last 168 hours. Hemoglobin A1C:  Recent Labs Lab 02/15/15 0925  HGBA1C 5.6   Fasting Lipid Panel:  Recent Labs Lab 02/18/15 0421  TRIG 205*   Thyroid Function Tests: No results for input(s): TSH, T4TOTAL, FREET4, T3FREE, THYROIDAB in the last 168 hours. Coagulation:  Recent Labs Lab 02/15/15 0925  LABPROT 14.9  INR 1.15   Anemia Panel:  Recent Labs Lab 02/15/15  0925  FERRITIN 7*  TIBC 360  IRON 5*  RETICCTPCT 2.7   Urine Drug Screen: Drugs of Abuse     Component Value Date/Time   LABOPIA RESULTS UNAVAILABLE DUE TO INTERFERING SUBSTANCE* 11/14/2014 0853   COCAINSCRNUR NONE DETECTED 11/14/2014 0853   LABBENZ NONE DETECTED 11/14/2014 0853   AMPHETMU RESULTS UNAVAILABLE DUE TO INTERFERING SUBSTANCE* 11/14/2014 0853   THCU RESULTS UNAVAILABLE DUE TO INTERFERING SUBSTANCE* 11/14/2014 0853   LABBARB NONE DETECTED 11/14/2014 0853    Alcohol Level: No results for input(s): ETH in the last 168 hours. Urinalysis:  Recent Labs Lab 02/15/15 0409  COLORURINE YELLOW  LABSPEC 1.019  PHURINE 5.5  GLUCOSEU NEGATIVE  HGBUR NEGATIVE  BILIRUBINUR NEGATIVE  KETONESUR NEGATIVE  PROTEINUR NEGATIVE  UROBILINOGEN 0.2  NITRITE NEGATIVE  LEUKOCYTESUR NEGATIVE    Micro Results: Recent Results (from the past 240 hour(s))    Culture, blood (routine x 2)     Status: None (Preliminary result)   Collection Time: 02/15/15  9:35 AM  Result Value Ref Range Status   Specimen Description BLOOD LEFT ANTECUBITAL  Final   Special Requests   Final    BOTTLES DRAWN AEROBIC AND ANAEROBIC 10CC AER 5CC ANA   Culture NO GROWTH 2 DAYS  Final   Report Status PENDING  Incomplete  Culture, blood (routine x 2)     Status: None (Preliminary result)   Collection Time: 02/15/15  9:40 AM  Result Value Ref Range Status   Specimen Description BLOOD LEFT HAND  Final   Special Requests BOTTLES DRAWN AEROBIC ONLY 10CC  Final   Culture NO GROWTH 2 DAYS  Final   Report Status PENDING  Incomplete  Culture, routine-abscess     Status: None   Collection Time: 02/15/15  5:39 PM  Result Value Ref Range Status   Specimen Description ABSCESS PELVIS  Final   Special Requests Normal  Final   Gram Stain   Final    MODERATE WBC PRESENT,BOTH PMN AND MONONUCLEAR NO SQUAMOUS EPITHELIAL CELLS SEEN MODERATE GRAM POSITIVE COCCI IN CHAINS Performed at [facility]    Culture   Final    MODERATE MICROAEROPHILIC STREPTOCOCCI Note: Standardized susceptibility testing for this organism is not available. Performed at [facility]    Report Status 02/18/2015 FINAL  Final  Culture, routine-abscess     Status: None (Preliminary result)   Collection Time: 02/15/15  5:40 PM  Result Value Ref Range Status   Specimen Description ABSCESS LEFT LOWER ABDOMEN  Final   Special Requests Normal  Final   Gram Stain   Final    MODERATE WBC PRESENT, PREDOMINANTLY PMN NO SQUAMOUS EPITHELIAL CELLS SEEN NO ORGANISMS SEEN Performed at [facility]    Culture   Final    NO GROWTH 2 DAYS Performed at [facility]    Report Status PENDING  Incomplete   Studies/Results: No results found. Medications: I have reviewed the patient's current medications. Scheduled Meds: . antiseptic oral rinse  7 mL Mouth Rinse BID  . enoxaparin  (LOVENOX) injection  40 mg Subcutaneous Q24H  . feeding supplement  1 Container Oral TID BM  . ferumoxytol  510 mg Intravenous Weekly  .  HYDROmorphone (DILAUDID) injection  2 mg Intravenous Q4H  . Influenza vac split quadrivalent PF  0.5 mL Intramuscular Tomorrow-1000  . insulin aspart  0-9 Units Subcutaneous 6 times per day  . methylPREDNISolone (SOLU-MEDROL) injection  40 mg Intravenous Q24H  . piperacillin-tazobactam (ZOSYN)  IV  3.375 g  Intravenous 3 times per day  . potassium chloride  10 mEq Intravenous Q1 Hr x 4   Continuous Infusions: . sodium chloride    . Marland KitchenTPN (CLINIMIX-E) Adult     And  . fat emulsion    . lactated ringers 75 mL/hr at 02/18/15 0948   PRN Meds:.acetaminophen, phenol, promethazine, sodium chloride Assessment/Plan: Principal Problem:   Intra-abdominal abscess Active Problems:   Crohn's colitis   Long-term current use of methadone for opiate dependence   Partial small bowel obstruction   Malnutrition of moderate degree   Iron deficiency anemia   History of elevated glucose  Crohn's disease with multiple Intraabdominal abscesses and EE fistulas: s/p percutaneous drainage x2 with drainage catheters in place. She reports 3-4 loose bowel movements with no blood or melena. Abdominal pain has improved though still sore and tender on the left side at the drain site. PICC in place, plan for TPN starting tonight. NG tube out, okay to advance to clear diet per surgery but nothing beyond that. Surgery recommending surgical intervention in 4-6 weeks after resolution of acute inflammation and infection. If she does well today, tolerating PO and continues to have BMs possible D/C tomorrow. If spoke with Dr. Cristina Gong today and he recommends probiotics with the ABX treatment. Agrees that Augmentin would be a reasonable choice of ABX for her. Appreciate his input.  - Will stop LR tonight with initiation of TPN therapy - Zosyn Day 3, will follow up abscess cultures and switch to  PO ABX (Augmentin) tomorrow if tolerating clear liquids today with open ended treatment course until resolution of the abscesses  - Phenergan 12.5 mg IV q4hr prn - Dilaudid 2 mg IV q4hr, will switch to her Methadone regimen tomorrow if tolerating PO today - Blood cultures NG x 2 days (drawn after Cipro given in ED) - Left lower abdomen abscess culture with NG at 2 days - Pelvis abscess culture with G+ cocci in chains growing moderate microaerophilic streptococci - Appreciate Surgery Consult - Solumedrol 40 mg IV today, will taper by 10 mg/day. Will transition to Prednisone and taper to her 10 mg daily dose.  - Will start probiotics today - Appreciate GI's assistance   Moderate Malnutrition: Albumin 2.8 on admission - Prealbumin 20.5  - PICC in place - Starting TPN today  History of elevated glucose: Elevated BGs on previous admission in June with ranging from 107-273. No history of DM. - A1c 5.6.  Methadone Dependence: Patient on Methadone 43 mg PO daily for chronic abdominal pain 2/2 Crohn's Disease. Will use Dilaudid to manage her pain as inpatient and transition back to Methadone after resuming PO intake.  - Continue Dilaudid 2 gm IV q4hr   Microcytic anemia: Fe 5, TIBC 360, % Sat 1%, Ferritin 7. Patient's H/H dropped to 6.3/22.4 yesterday. Got 1 unit PRBC. Hvb stable today at 8.2. - IV Fe today - s/p 1 unit PRBC yesterday   FEN: Clear liquids  DVT PPx: Lovenox  Dispo: Disposition is deferred at this time, awaiting improvement of current medical problems.  Anticipated discharge in approximately 1-2 day(s).   The patient does not have a current PCP (No Pcp Per Patient) and does need an [facility] hospital follow-up appointment after discharge.  The patient does not have transportation limitations that hinder transportation to clinic appointments.  .Services Needed at time of discharge: Y = Yes, Blank = No PT:   OT:   RN:   Equipment:   Other:     LOS: 3 days  Krystal Pile, MD IMTS PGY-1 [phone] 02/18/2015, 10:14 AM

## 2015-02-18 NOTE — Progress Notes (Addendum)
[facility] NOTE  Pharmacy Consult for TPN Indication: SBO and Prolonged NPO status  Allergies  Allergen Reactions  . Sulfa Antibiotics Rash    Patient Measurements: Height: 5\' 2"  (157.5 cm) Weight: 116 lb 10 oz (52.9 kg) IBW/kg (Calculated) : 50.1  Vital Signs: Temp: 97.7 F (36.5 C) (09/13 0411) Temp Source: Oral (09/13 0411) BP: 113/63 mmHg (09/13 0411) Pulse Rate: 74 (09/13 0411) Intake/Output from previous day: 09/12 0701 - 09/13 0700 In: 710 [I.V.:700] Out: 1205 [Urine:1175; Drains:30] Intake/Output from this shift:    Labs:  Recent Labs  02/15/15 0925  02/17/15 0330 02/17/15 1815 02/18/15 0421  WBC 32.9*  < > 19.7* 19.0* 20.0*  HGB 7.7*  < > 6.3* 7.6* 8.2*  HCT 26.3*  < > 22.4* 25.2* 26.7*  PLT 353  < > 369 319 404*  APTT 26  --   --   --   --   INR 1.15  --   --   --   --   < > = values in this interval not displayed.   Recent Labs  02/15/15 0925 02/16/15 0407 02/18/15 0421  NA  --  138 143  K  --  3.9 3.4*  CL  --  105 108  CO2  --  27 28  GLUCOSE  --  148* 91  BUN  --  9 17  CREATININE  --  0.75 0.95  CALCIUM  --  8.8* 9.1  MG 1.6* 2.1 2.0  PHOS 3.2  --  4.1  PROT  --   --  5.8*  ALBUMIN  --   --  2.3*  AST  --   --  13*  ALT  --   --  10*  ALKPHOS  --   --  51  BILITOT  --   --  0.4  PREALBUMIN 20.5  --  13.7*  TRIG  --   --  205*   Estimated Creatinine Clearance: 54.8 mL/min (by C-G formula based on Cr of 0.95).   No results for input(s): GLUCAP in the last 72 hours.    Insulin Requirements in the past 24 hours:  None  Nutritional Goals:  1500-1700 kcal, 75-90g protein per RD assessment 9/12  Current Nutrition:  NPO  Assessment: 54 YO female presents w/ LLQ pain, nausea, and abd distension. Hx of Crohn's and bowel obstruction. Zosyn was started for R pelvic abscesses. Pt reports no issues up until the night of 9/9 when she started having abdominal pain. Pharmacy consulted to start TPN for SBO and presumed  prolonged NPO status. Since patient had poor diet only for a few days PTA, decision was made to wait until day 5 of being NPO prior to starting TPN. Also, PICC could not be placed until blood cultures were negative x48h. Surgery does plan to take to surgery but is waiting days to weeks for current abscess/inflammatory process to improve.  Surgeries/Procedures: pending  GI: Hx of Crohn's disease. Abdominal pain has greatly improved over last couple days. No nausea with NGT clamped. Passing some flatus and feels like she needs to have a BM. Right drain with dark bloody output. Prealbumin fell from 20.5 to 13.7.    Endo: No hx of DM. AM glucose ok. Methylprednisolone taper started  Lytes: Lytes wnl. Phos and Mg ok   Renal: SCr 0.95, CrCl ~55-60 ml/min. Lytes are good at baseline except K is low at 3.4. Phos 4.1, mag 2, CorCa ~10.3  Pulm: RA  Cards:  BP soft and HR ok. No CV meds  Hepatobil: AST/ALT low at 13/10, tbili and alk phos nml. Albumin low at 2.3. Trigs elevated at baseline at 205  Neuro: dilaudid prn  Heme: Tsat 1%, ferritin 7. Hgb and plts dropped. 1 unit blood given yesterday, Feraheme x1 ordered for today  ID: Day #4 of zosyn for crohn's colits with right pelvic abscesses. s/p drainage and drain placement of abscesses. Afebrile, WBC elevated at 20 but down from a few days ago (on steroids).  9/10 Blood cx @ ~0940: NGTD 9/10 abscess: mod microaerophilic strep  0/04 Zosyn >>  Best Practices: SCDs  TPN Access: PICC placed 9/12  TPN start date: 9/13>>  Plan:  - replete potassium with KCl 61mEq IV runs x4 - start Clinimix E 5/15 at 9mL/hr + IVFE 20% at 72mL/hr- this will provide 48g protein and 1162 kcal per day.  *goal rate will be Clinimix 5/15 at 61mL/hr + IVFE 20% at 74mL/hr- this combination will provide 84g protein and 1672 kcal per day which will meet 100% of patient's needs - ok to give lipids now with elevated triglycerides, but lipids will need to be held if  triglycerides approach 400 (will check sooner than Monday to ensure this does not occur) - full multivitamin and trace elements in TPN - start CBGs and sensitive SSI q4h - change MIVF to NS at 59mL/hr at 1800 tonight when TPN starts in order to prevent extra electrolytes from being given as well as compensate for large volume being given through TPN - BMET, mag and phos in the morning to assess refeeding   Vesta Wheeland D. Malon Siddall, PharmD, BCPS Clinical Pharmacist Pager: [phone] 02/18/2015 8:14 AM

## 2015-02-18 NOTE — Progress Notes (Signed)
Case discussed at length with medical resident, Dr. Maryellen Pile.  Patient seen and examined.  Her pain is doing substantially better since having the percutaneous drains placed and being on antibiotic therapy. She still has moderate residual pain in the left lower quadrant, but she emphasizes that it is nothing nearly as severe as that with which she presented to the hospital with.  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  As I explained to the patient, there is no set period of time that she will have to be on antibiotic therapy; it will depend on her clinical and radiographic response. Presumably, the surgeons will be monitoring this as an outpatient following discharge.   I will have relatively little input to provide until after she has surgery, at which time I will want to start her on either 6-MP or an anti-TNF agent to prevent recurrence of her Crohn's.  I will sign off at this time, in anticipation of the patient possibly being discharged tomorrow. However, please call if I can be of further assistance in her management.  Cleotis Nipper, M.D. Pager [phone] If no answer or after 5 PM call [phone]

## 2015-02-18 NOTE — Progress Notes (Signed)
Patient ID: Krystal [name], female   DOB: 12/29/1960, 54 y.o.   MRN: [id number]    Subjective: Pt feels ok today, but had a BM and is having some worse left sided abdominal pain at her drain site.  Otherwise would like some hot tea.  Objective: Vital signs in last 24 hours: Temp:  [97.7 F (36.5 C)-98 F (36.7 C)] 97.7 F (36.5 C) (09/13 0411) Pulse Rate:  [56-74] 74 (09/13 0411) Resp:  [17-18] 18 (09/13 0411) BP: (110-113)/(63-71) 113/63 mmHg (09/13 0411) SpO2:  [99 %-100 %] 99 % (09/13 0411) Weight:  [52.9 kg (116 lb 10 oz)] 52.9 kg (116 lb 10 oz) (09/13 0411) Last BM Date: 02/17/15  Intake/Output from previous day: 09/12 0701 - 09/13 0700 In: 710 [I.V.:700] Out: 1205 [Urine:1175; Drains:30] Intake/Output this shift:    PE: Abd: soft, still bloated, +BS (borborygmi), tender on the left side of her abdomen the greatest.  Right sided drain with minimal output now from just being drained, but has flecks of brown in the drain c/w with possible fistula.  Left sided drain with tan milky purulent drainage Heart: regular Lungs: CTAb  Lab Results:   Recent Labs  02/17/15 1815 02/18/15 0421  WBC 19.0* 20.0*  HGB 7.6* 8.2*  HCT 25.2* 26.7*  PLT 319 404*   BMET  Recent Labs  02/16/15 0407 02/18/15 0421  NA 138 143  K 3.9 3.4*  CL 105 108  CO2 27 28  GLUCOSE 148* 91  BUN 9 17  CREATININE 0.75 0.95  CALCIUM 8.8* 9.1   PT/INR  Recent Labs  02/15/15 0925  LABPROT 14.9  INR 1.15   CMP     Component Value Date/Time   NA 143 02/18/2015 0421   K 3.4* 02/18/2015 0421   CL 108 02/18/2015 0421   CO2 28 02/18/2015 0421   GLUCOSE 91 02/18/2015 0421   BUN 17 02/18/2015 0421   CREATININE 0.95 02/18/2015 0421   CALCIUM 9.1 02/18/2015 0421   PROT 5.8* 02/18/2015 0421   ALBUMIN 2.3* 02/18/2015 0421   AST 13* 02/18/2015 0421   ALT 10* 02/18/2015 0421   ALKPHOS 51 02/18/2015 0421   BILITOT 0.4 02/18/2015 0421   GFRNONAA >60 02/18/2015 0421   GFRAA >60 02/18/2015  0421   Lipase     Component Value Date/Time   LIPASE 15* 02/15/2015 0315       Studies/Results: No results found.  Anti-infectives: Anti-infectives    Start     Dose/Rate Route Frequency Ordered Stop   02/15/15 2130  ciprofloxacin (CIPRO) IVPB 400 mg  Status:  Discontinued     400 mg 200 mL/hr over 60 Minutes Intravenous Every 12 hours 02/15/15 0920 02/15/15 1117   02/15/15 1400  piperacillin-tazobactam (ZOSYN) IVPB 3.375 g     3.375 g 12.5 mL/hr over 240 Minutes Intravenous 3 times per day 02/15/15 1200     02/15/15 0745  ciprofloxacin (CIPRO) IVPB 400 mg     400 mg 200 mL/hr over 60 Minutes Intravenous  Once 02/15/15 0736 02/15/15 1109   02/15/15 0745  metroNIDAZOLE (FLAGYL) IVPB 500 mg  Status:  Discontinued     500 mg 100 mL/hr over 60 Minutes Intravenous  Once 02/15/15 0736 02/15/15 1117       Assessment/Plan  1. Crohn's disease with multiple intra-abdominal fluid collections and EE fistulas  -cont with perc drains to help resolve these abscesses -she has significant inflammation and infection as this time. Ideally, we would like for this to improve  before considering an operation as she would be high risk for complications right now. -will try clear liquids today, but would not advance past this. -PICC/TNA, add resource breeze to help with caloric intake as well -cont abx therapy, Zosyn D3 -spoke to patient and family at length today about plans to try and get her better and plan for surgical intervention down the road, likely in a 4-6 week time frame if able.  She may need to keep these drains the whole time or part time.  It depends on if she develops fistulas to these drains or not.  They all understand and are very appreciative. Anemia, likely of chronic disease -hgb up to 8.2 DVT Prophylaxis -SCDs/defer chemical to primary service  LOS: 3 days    Rhegan Trunnell E 02/18/2015, 9:23 AM Pager: [phone]

## 2015-02-18 NOTE — Progress Notes (Signed)
Referring Physician(s): CCS  Chief Complaint: Follow up after placement if percutaneous drain to treat abscess secondary to Crohn's colitis  Subjective: Pt feeling better Some soreness at left drain site. Just starting to try some clears this am  Allergies: Sulfa antibiotics  Medications:  Current facility-administered medications:  .  0.9 %  sodium chloride infusion, , Intravenous, Continuous, Lauren D Bajbus, RPH .  acetaminophen (TYLENOL) suppository 650 mg, 650 mg, Rectal, Q6H PRN, Juluis Mire, MD, 650 mg at 02/15/15 1222 .  antiseptic oral rinse (CPC / CETYLPYRIDINIUM CHLORIDE 0.05%) solution 7 mL, 7 mL, Mouth Rinse, BID, Oval Linsey, MD, 7 mL at 02/18/15 1036 .  enoxaparin (LOVENOX) injection 40 mg, 40 mg, Subcutaneous, Q24H, Maryellen Pile, MD, 40 mg at 02/17/15 2000 .  TPN (CLINIMIX-E) Adult, , Intravenous, Continuous TPN **AND** fat emulsion 20 % infusion 240 mL, 240 mL, Intravenous, Continuous TPN, Lauren D Bajbus, RPH .  feeding supplement (BOOST / RESOURCE BREEZE) liquid 1 Container, 1 Container, Oral, TID BM, Saverio Danker, PA-C .  ferumoxytol [facility]) 510 mg in sodium chloride 0.9 % 100 mL IVPB, 510 mg, Intravenous, Weekly, Marjan Rabbani, MD .  HYDROmorphone (DILAUDID) injection 2 mg, 2 mg, Intravenous, Q4H, Maryellen Pile, MD, 2 mg at 02/18/15 0807 .  Influenza vac split quadrivalent PF (FLUARIX) injection 0.5 mL, 0.5 mL, Intramuscular, Tomorrow-1000, Oval Linsey, MD .  insulin aspart (novoLOG) injection 0-9 Units, 0-9 Units, Subcutaneous, 6 times per day, Lauren D Bajbus, RPH .  lactated ringers infusion, , Intravenous, Continuous, Lauren D Bajbus, RPH, Last Rate: 75 mL/hr at 02/18/15 0948 .  [START ON 02/19/2015] methylPREDNISolone sodium succinate (SOLU-MEDROL) 40 mg/mL injection 30 mg, 30 mg, Intravenous, Once, Maryellen Pile, MD .  phenol (CHLORASEPTIC) mouth spray 2 spray, 2 spray, Mouth/Throat, Q2H PRN, [name], MD .   piperacillin-tazobactam (ZOSYN) IVPB 3.375 g, 3.375 g, Intravenous, 3 times per day, Roma Schanz, RPH, 3.375 g at 02/18/15 0523 .  potassium chloride 10 mEq in 50 mL *CENTRAL LINE* IVPB, 10 mEq, Intravenous, Q1 Hr x 4, Lauren D Bajbus, RPH, 10 mEq at 02/18/15 1034 .  promethazine (PHENERGAN) injection 12.5 mg, 12.5 mg, Intravenous, Q4H PRN, Marjan Rabbani, MD, 12.5 mg at 02/15/15 1613 .  sodium chloride 0.9 % injection 10-40 mL, 10-40 mL, Intracatheter, PRN, Annia Belt, MD, 10 mL at 02/18/15 0420     Vital Signs: BP 113/63 mmHg  Pulse 74  Temp(Src) 97.7 F (36.5 C) (Oral)  Resp 18  Ht 5\' 2"  (1.575 m)  Wt 116 lb 10 oz (52.9 kg)  BMI 21.33 kg/m2  SpO2 99%  Physical Exam Abd: soft  LLQ drain intact, thin purulent output (R)TG drain intact, site clean, output scant but appears feculent  Imaging: Ct Abdomen Pelvis W Contrast  02/15/2015   CLINICAL DATA:  Abdominal pain, left lower quadrant.  EXAM: CT ABDOMEN AND PELVIS WITH CONTRAST  TECHNIQUE: Multidetector CT imaging of the abdomen and pelvis was performed using the standard protocol following bolus administration of intravenous contrast.  CONTRAST:  28mL OMNIPAQUE IOHEXOL 300 MG/ML  SOLN  COMPARISON:  11/13/2014  FINDINGS: Lower chest and abdominal wall:  No contributory findings.  Hepatobiliary: No focal liver abnormality.Chronic dilation of the common bile duct to 8 mm. No visible calcified choledocholithiasis. Liver function tests are pending.  Pancreas: Stable mild prominence of the main pancreatic duct.  Spleen: Unremarkable.  Adrenals/Urinary Tract: Negative adrenals. No hydronephrosis or stone. Too small to characterize low-density in the lower pole left  kidney, stable and statistically a cyst. Unremarkable bladder.  Reproductive:Hysterectomy and possible oophorectomies.  Stomach/Bowel: Multi focal skip areas of bowel inflammation with wall thickening from submucosal edema and hypervascular mucosa. Findings consistent  with Crohn's disease. 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 These are marked in the right abdomen on image 55 series 2. There are 5 distinct fluid collections with rim enhancement, some with internal gas, consistent with abscess:  1. Isolated right pericolic gutter at 15 mm maximal diameter. 2. Rectovaginal recess at 42 mm diameter. 3. Posterior to the bladder is a flat collection measuring 38 mm in width by 12 mm in thickness. 4. Ventral to this collection is a discrete appearing bilobed collection with small communicating channel. The upper collection measures 53 mm, the lower 40 mm. 5. Ventral to this collection is a discrete appearing collection which measures 49 x 14 mm axial dimension. 6. 7. Intermittent areas of small bowel distention compatible with partial obstruction.  Vascular/Lymphatic: No acute vascular abnormality. Reactive mesenteric adenopathy.  Musculoskeletal: No acute abnormalities.  IMPRESSION: 1. Crohn's with progressive penetrating disease and multiloculated pelvic and right flank abscess, as described above. 2. Chronically dilated common bile duct. Correlate with pending liver function tests.   Electronically Signed   By: Monte Fantasia M.D.   On: 02/15/2015 07:25   Ct Image Guided Fluid Drain By Catheter  02/15/2015   CLINICAL DATA:  54 year old female with a history of Crohn's disease, presents with multiloculated abscess of the abdomen pelvis.  She has been referred for evaluation for drain placement.  EXAM: CT GUIDED ABSCESS DRAIN BIOPSY OF PELVIC ABSCESS AND LEFT LOWER QUADRANT ABDOMINAL ABSCESS  ANESTHESIA/SEDATION: 3.0  Mg IV Versed; 150 mcg IV Fentanyl  Total Moderate Sedation Time: 45 minutes.  PROCEDURE: The procedure risks, benefits, and alternatives were explained to the patient. Questions regarding the procedure were encouraged and answered. The patient understands and consents to the procedure.  Patient was first  position in prone position on CT gantry table and a CT of the abdomen pelvis was performed for planning purposes.  Safe angle approach was determined for right trans gluteal access to pelvic abscess, and the patient is prepped and draped in the usual sterile fashion.  The right gluteal region was prepped with chlorhexidinein a sterile fashion, and a sterile drape was applied covering the operative field. A sterile gown and sterile gloves were used for the procedure. Local anesthesia was provided with 1% Lidocaine.  Once the patient is prepped and draped sterilely, 1% lidocaine was used for local anesthesia. A small skin incision was made with 11 blade scalpel, and a trocar needle was advanced safely into pelvic abscess using CT guidance. We confirmed needle tip position with aspiration of purulent material and was CT imaging.  Using modified Seldinger technique, a 10 French pigtail catheter drain was placed. Approximately 60 cc of highly viscous purulent fluid was aspirated. A sample sent to the lab.  The catheter pigtail was locked, and the catheter was sutured in position. A sterile bandage was placed.  The patient was then turned in supine position for drainage of a left lower quadrant abscess.  The left lower quadrant was prepped with chlorhexidinein a sterile fashion, and a sterile drape was applied covering the operative field. A sterile gown and sterile gloves were used for the procedure. Local anesthesia was provided with 1% Lidocaine.  Once the patient is prepped and draped sterilely, 1% lidocaine was used for  local anesthesia. A small stab incision was made with 11 blade scalpel, and a trocar needle was advanced with CT guidance into fluid collection of the left lower quadrant. We confirmed position with aspiration of fluid and with CT imaging.  Using modified Seldinger technique, a 10 French pigtail catheter drain was placed into the left lower quadrant fluid collection. Approximately 50 cc of turbid thin  fluid was aspirated. Drainage bag was placed. Catheter was sutured in position and a sterile dressing was placed.  The patient tolerated the procedure well and remained hemodynamically stable throughout.  No complications were encountered and no significant blood loss was encountered.  FINDINGS: Initial CT imaging in the prone position demonstrates abscess within the pelvis, similar in size and location to prior cross-sectional imaging.  Images during the case demonstrate safe placement of 10 French catheter into pelvic abscess. 60 cc of viscous purulent fluid was removed.  In knee supine position, CT image demonstrates fluid collection of the left lower quadrant (images stored in PACs are mislabeled and reversed).  After placement of 10 French catheter, there is significant decompression of this fluid collection. 50 cc of turbid thin fluid removed.  IMPRESSION: Status post placement of right trans gluteal 10 French pigtail catheter into pelvic abscess, as well as percutaneous 10 French catheter placement into left lower quadrant fluid collection. Sample was sent from each collection to the lab for analysis.  Signed,  Dulcy Fanny. Earleen Newport, DO  [facility]  [facility]   Electronically Signed   By: Corrie Mckusick D.O.   On: 02/15/2015 15:55    Labs:  CBC:  Recent Labs  02/16/15 0407 02/17/15 0330 02/17/15 1815 02/18/15 0421  WBC 31.4* 19.7* 19.0* 20.0*  HGB 7.1* 6.3* 7.6* 8.2*  HCT 23.9* 22.4* 25.2* 26.7*  PLT 328 369 319 404*    COAGS:  Recent Labs  02/15/15 0925  INR 1.15  APTT 26    BMP:  Recent Labs  11/21/14 0536 02/15/15 0315 02/16/15 0407 02/18/15 0421  NA 135 139 138 143  K 4.1 4.1 3.9 3.4*  CL 97* 104 105 108  CO2 29 25 27 28   GLUCOSE 129* 136* 148* 91  BUN 14 11 9 17   CALCIUM 7.9* 8.8* 8.8* 9.1  CREATININE 0.73 0.80 0.75 0.95  GFRNONAA >60 >60 >60 >60  GFRAA >60 >60 >60 >60    LIVER FUNCTION TESTS:  Recent Labs   11/13/14 0258 11/14/14 0322 02/15/15 0315 02/18/15 0421  BILITOT 0.5 0.4 0.3 0.4  AST 20 20 15  13*  ALT 17 19 10* 10*  ALKPHOS 124 73 57 51  PROT 6.5 5.4* 6.3* 5.8*  ALBUMIN 2.7* 1.9* 2.8* 2.3*    Assessment and Plan: Crohn's colitis with abscesses S/P CT guided right transgluteal drain and left lower quadrant drain on 02/15/15 by Dr. Earleen Newport (R)TG drain concerning for fistula/feculent output Continue to follow Other plans per CCS  Signed: Ascencion Dike 02/18/2015, 10:36 AM   I spent a total of 15 Minutes at the the patient's bedside AND on the patient's hospital floor or unit, greater than 50% of which was counseling/coordinating care for Crohn's colitis with abscess drainage

## 2015-02-19 LAB — CBC
HEMATOCRIT: 25.9 % — AB (ref 36.0–46.0)
HEMOGLOBIN: 7.9 g/dL — AB (ref 12.0–15.0)
MCH: 22.6 pg — AB (ref 26.0–34.0)
MCHC: 30.5 g/dL (ref 30.0–36.0)
MCV: 74.2 fL — ABNORMAL LOW (ref 78.0–100.0)
Platelets: 408 10*3/uL — ABNORMAL HIGH (ref 150–400)
RBC: 3.49 MIL/uL — AB (ref 3.87–5.11)
RDW: 17.6 % — ABNORMAL HIGH (ref 11.5–15.5)
WBC: 11.1 10*3/uL — ABNORMAL HIGH (ref 4.0–10.5)

## 2015-02-19 LAB — GLUCOSE, CAPILLARY
GLUCOSE-CAPILLARY: 107 mg/dL — AB (ref 65–99)
GLUCOSE-CAPILLARY: 113 mg/dL — AB (ref 65–99)
GLUCOSE-CAPILLARY: 151 mg/dL — AB (ref 65–99)
GLUCOSE-CAPILLARY: 91 mg/dL (ref 65–99)
Glucose-Capillary: 111 mg/dL — ABNORMAL HIGH (ref 65–99)
Glucose-Capillary: 133 mg/dL — ABNORMAL HIGH (ref 65–99)

## 2015-02-19 LAB — BASIC METABOLIC PANEL
Anion gap: 6 (ref 5–15)
BUN: 9 mg/dL (ref 6–20)
CALCIUM: 8.7 mg/dL — AB (ref 8.9–10.3)
CO2: 27 mmol/L (ref 22–32)
CREATININE: 0.73 mg/dL (ref 0.44–1.00)
Chloride: 106 mmol/L (ref 101–111)
GFR calc Af Amer: >60 mL/min (ref >60)
GLUCOSE: 136 mg/dL — AB (ref 65–99)
Potassium: 3.8 mmol/L (ref 3.5–5.1)
Sodium: 139 mmol/L (ref 135–145)

## 2015-02-19 LAB — PHOSPHORUS: Phosphorus: 2.9 mg/dL (ref 2.5–4.6)

## 2015-02-19 LAB — MAGNESIUM: Magnesium: 2 mg/dL (ref 1.7–2.4)

## 2015-02-19 MED ORDER — CIPROFLOXACIN HCL 500 MG PO TABS
500.0000 mg | ORAL_TABLET | Freq: Two times a day (BID) | ORAL | Status: DC
  Administered 2015-02-19: 500 mg via ORAL
  Filled 2015-02-19: qty 1

## 2015-02-19 MED ORDER — METHADONE HCL 10 MG PO TABS: 42.5000 mg | ORAL_TABLET | Freq: Every day | ORAL | Status: DC

## 2015-02-19 MED ORDER — PREDNISONE 20 MG PO TABS: 20.0000 mg | ORAL_TABLET | Freq: Every day | ORAL | Status: DC

## 2015-02-19 MED ORDER — METHADONE HCL 10 MG PO TABS
42.5000 mg | ORAL_TABLET | Freq: Every day | ORAL | Status: DC
  Administered 2015-02-19 – 2015-02-20 (×2): 45 mg via ORAL
  Filled 2015-02-19 (×2): qty 5

## 2015-02-19 MED ORDER — AMOXICILLIN-POT CLAVULANATE 500-125 MG PO TABS
500.0000 mg | ORAL_TABLET | Freq: Two times a day (BID) | ORAL | Status: DC
  Administered 2015-02-19 – 2015-02-20 (×2): 500 mg via ORAL
  Filled 2015-02-19 (×3): qty 1

## 2015-02-19 MED ORDER — METRONIDAZOLE 500 MG PO TABS
500.0000 mg | ORAL_TABLET | Freq: Two times a day (BID) | ORAL | Status: DC
  Administered 2015-02-19: 500 mg via ORAL
  Filled 2015-02-19: qty 1

## 2015-02-19 MED ORDER — PREDNISONE 10 MG PO TABS: 10.0000 mg | ORAL_TABLET | Freq: Every day | ORAL | Status: DC

## 2015-02-19 MED ORDER — SIMETHICONE 80 MG PO CHEW
80.0000 mg | CHEWABLE_TABLET | Freq: Four times a day (QID) | ORAL | Status: DC | PRN
  Administered 2015-02-19 – 2015-02-20 (×2): 80 mg via ORAL
  Filled 2015-02-19 (×2): qty 1

## 2015-02-19 MED ORDER — HYDROMORPHONE HCL 1 MG/ML IJ SOLN
1.0000 mg | INTRAMUSCULAR | Status: DC | PRN
  Administered 2015-02-19: 1 mg via INTRAVENOUS
  Filled 2015-02-19: qty 1

## 2015-02-19 MED ORDER — PREDNISONE 20 MG PO TABS
30.0000 mg | ORAL_TABLET | Freq: Every day | ORAL | Status: AC
  Administered 2015-02-20: 30 mg via ORAL
  Filled 2015-02-19 (×2): qty 1

## 2015-02-19 NOTE — Progress Notes (Signed)
Subjective: Krystal [name] is doing well this morning. She reports she had watery diarrhea several times overnight that kept her up all night but is otherwise doing well. Denies any bright red blood or melena. Abdominal pain is well controlled with only slight tenderness around her drains.   Objective: Vital signs in last 24 hours: Filed Vitals:   02/18/15 0411 02/18/15 1400 02/18/15 2006 02/19/15 0327  BP: 113/63 103/62 105/71 125/70  Pulse: 74 67 57 58  Temp: 97.7 F (36.5 C) 97.8 F (36.6 C) 98.6 F (37 C) 97.8 F (36.6 C)  TempSrc: Oral Oral Oral Oral  Resp: 18 18 18 18   Height:      Weight: 116 lb 10 oz (52.9 kg)   115 lb 8.3 oz (52.4 kg)  SpO2: 99% 96% 97% 99%   Weight change: -1 lb 1.6 oz (-0.5 kg)  Intake/Output Summary (Last 24 hours) at 02/19/15 0824 Last data filed at 02/19/15 0550  Gross per 24 hour  Intake    830 ml  Output     25 ml  Net    805 ml   Physical Exam GENERAL- alert, co-operative, appears as stated age, in distress HEENT- Atraumatic, normocephalic, PERRL, EOMI, oral mucosa appears moist CARDIAC- RRR, no murmurs, rubs or gallops. RESP- Moving equal volumes of air, and clear to auscultation bilaterally, no wheezes or crackles.  ABDOMEN- Abdomen is soft and non-tender, no guarding or rebound. Normal bowel sounds.  NEURO- No obvious Cr N abnormality, strenght upper and lower extremities- 5/5, Sensation intact- globally EXTREMITIES- pulse 2+, symmetric, no pedal edema. SKIN- Warm, dry, No rash or lesion. PSYCH- Normal mood and affect, appropriate thought content and speech.  Lab Results: Basic Metabolic Panel:  Recent Labs Lab 02/18/15 0421 02/19/15 0550  NA 143 139  K 3.4* 3.8  CL 108 106  CO2 28 27  GLUCOSE 91 136*  BUN 17 9  CREATININE 0.95 0.73  CALCIUM 9.1 8.7*  MG 2.0 2.0  PHOS 4.1 2.9   Liver Function Tests:  Recent Labs Lab 02/15/15 0315 02/18/15 0421  AST 15 13*  ALT 10* 10*  ALKPHOS 57 51  BILITOT 0.3 0.4  PROT 6.3*  5.8*  ALBUMIN 2.8* 2.3*    Recent Labs Lab 02/15/15 0315  LIPASE 15*   CBC:  Recent Labs Lab 02/15/15 0925  02/18/15 0421 02/19/15 0550  WBC 32.9*  < > 20.0* 11.1*  NEUTROABS 27.7*  --  16.8*  --   HGB 7.7*  < > 8.2* 7.9*  HCT 26.3*  < > 26.7* 25.9*  MCV 71.9*  < > 73.6* 74.2*  PLT 353  < > 404* 408*  < > = values in this interval not displayed.  CBG:  Recent Labs Lab 02/18/15 1636 02/18/15 2005 02/18/15 2335 02/19/15 0331  GLUCAP 154* 138* 130* 111*   Hemoglobin A1C:  Recent Labs Lab 02/15/15 0925  HGBA1C 5.6   Fasting Lipid Panel:  Recent Labs Lab 02/18/15 0421  TRIG 205*   Thyroid Function Tests: No results for input(s): TSH, T4TOTAL, FREET4, T3FREE, THYROIDAB in the last 168 hours. Coagulation:  Recent Labs Lab 02/15/15 0925  LABPROT 14.9  INR 1.15   Anemia Panel:  Recent Labs Lab 02/15/15 0925  FERRITIN 7*  TIBC 360  IRON 5*  RETICCTPCT 2.7   Urine Drug Screen: Drugs of Abuse     Component Value Date/Time   LABOPIA RESULTS UNAVAILABLE DUE TO INTERFERING SUBSTANCE* 11/14/2014 [place] DETECTED 11/14/2014 4268  LABBENZ NONE DETECTED 11/14/2014 0853   AMPHETMU RESULTS UNAVAILABLE DUE TO INTERFERING SUBSTANCE* 11/14/2014 0853   THCU RESULTS UNAVAILABLE DUE TO INTERFERING SUBSTANCE* 11/14/2014 0853   LABBARB NONE DETECTED 11/14/2014 0853    Alcohol Level: No results for input(s): ETH in the last 168 hours. Urinalysis:  Recent Labs Lab 02/15/15 0409  COLORURINE YELLOW  LABSPEC 1.019  PHURINE 5.5  GLUCOSEU NEGATIVE  HGBUR NEGATIVE  BILIRUBINUR NEGATIVE  KETONESUR NEGATIVE  PROTEINUR NEGATIVE  UROBILINOGEN 0.2  NITRITE NEGATIVE  LEUKOCYTESUR NEGATIVE    Micro Results: Recent Results (from the past 240 hour(s))  Culture, blood (routine x 2)     Status: None (Preliminary result)   Collection Time: 02/15/15  9:35 AM  Result Value Ref Range Status   Specimen Description BLOOD LEFT ANTECUBITAL  Final    Special Requests   Final    BOTTLES DRAWN AEROBIC AND ANAEROBIC 10CC AER 5CC ANA   Culture NO GROWTH 3 DAYS  Final   Report Status PENDING  Incomplete  Culture, blood (routine x 2)     Status: None (Preliminary result)   Collection Time: 02/15/15  9:40 AM  Result Value Ref Range Status   Specimen Description BLOOD LEFT HAND  Final   Special Requests BOTTLES DRAWN AEROBIC ONLY 10CC  Final   Culture NO GROWTH 3 DAYS  Final   Report Status PENDING  Incomplete  Culture, routine-abscess     Status: None   Collection Time: 02/15/15  5:39 PM  Result Value Ref Range Status   Specimen Description ABSCESS PELVIS  Final   Special Requests Normal  Final   Gram Stain   Final    MODERATE WBC PRESENT,BOTH PMN AND MONONUCLEAR NO SQUAMOUS EPITHELIAL CELLS SEEN MODERATE GRAM POSITIVE COCCI IN CHAINS Performed at [facility]    Culture   Final    MODERATE MICROAEROPHILIC STREPTOCOCCI Note: Standardized susceptibility testing for this organism is not available. Performed at [facility]    Report Status 02/18/2015 FINAL  Final  Culture, routine-abscess     Status: None (Preliminary result)   Collection Time: 02/15/15  5:40 PM  Result Value Ref Range Status   Specimen Description ABSCESS LEFT LOWER ABDOMEN  Final   Special Requests Normal  Final   Gram Stain   Final    MODERATE WBC PRESENT, PREDOMINANTLY PMN NO SQUAMOUS EPITHELIAL CELLS SEEN NO ORGANISMS SEEN Performed at [facility]    Culture   Final    NO GROWTH 2 DAYS Performed at [facility]    Report Status PENDING  Incomplete   Studies/Results: No results found. Medications: I have reviewed the patient's current medications. Scheduled Meds: . antiseptic oral rinse  7 mL Mouth Rinse BID  . enoxaparin (LOVENOX) injection  40 mg Subcutaneous Q24H  . feeding supplement  1 Container Oral TID BM  . ferumoxytol  510 mg Intravenous Weekly  .  HYDROmorphone (DILAUDID) injection  2 mg Intravenous Q4H    . Influenza vac split quadrivalent PF  0.5 mL Intramuscular Tomorrow-1000  . insulin aspart  0-9 Units Subcutaneous 6 times per day  . piperacillin-tazobactam (ZOSYN)  IV  3.375 g Intravenous 3 times per day  . saccharomyces boulardii  250 mg Oral BID   Continuous Infusions: . sodium chloride 35 mL/hr at 02/18/15 1733  . Marland KitchenTPN (CLINIMIX-E) Adult 40 mL/hr at 02/18/15 1721   And  . fat emulsion 240 mL (02/18/15 1722)   PRN Meds:.acetaminophen, phenol, promethazine, sodium chloride Assessment/Plan: Principal Problem:  Intra-abdominal abscess Active Problems:   Crohn's colitis   Long-term current use of methadone for opiate dependence   Partial small bowel obstruction   Malnutrition of moderate degree   Iron deficiency anemia   History of elevated glucose  Crohn's disease with multiple Intraabdominal abscesses and EE fistulas: s/p percutaneous drainage x2 with drainage catheters in place. Having diarrhea without blood, mucus or melena. PICC in place and started TPN last night. On clear liquid diet. Will switch her to PO ABX and pain medicine today. Follow up with surgery in 4-6 weeks. Continue ABX indefinitely until abscesses resolve. After discussion with GI and Surgery, we will advance her diet today and ensure she is able to have adequate caloric and protein intake prior to discharge. We will re-evaluate tomorrow, if tolerating diet well can take out the PICC line and go home without TPN.  - Will d/c Zosyn (Day 4) - Left lower abdomen abscess culture with NG at 2 days - Pelvis abscess culture with G+ cocci in chains growing moderate microaerophilic streptococci, no sensitivities back yet - Started Cipro and Flagyl today. Her home methadone suspension contains ethanol. Potential to have disulfram reaction. Will start her on Augmentin.   - Phenergan 12.5 mg IV q4hr prn - Methadone home dose per pharmacy - Dilaudid 1 mg IV q4hr prn, - Blood cultures NG x 3 days (drawn after Cipro given  in ED) - Appreciate Surgery Consult - Solumedrol 30 mg IV today. Will transition to Prednisone 30 mg and taper to her 10 mg daily home dose.  - Cotninue probiotics  - Appreciate GI's assistance   Moderate Malnutrition: Albumin 2.8 on admission with prealbumin 20.5. Repeat prealbumin 13.7. - PICC in place - TPN  - Reassess tomorrow  History of elevated glucose: Elevated BGs on previous admission in June with ranging from 107-273. No history of DM. - A1c 5.6.  Methadone Dependence: Patient on Methadone 43 mg PO daily for chronic abdominal pain 2/2 Crohn's Disease. - Dilaudid 1 gm IV q4hr prn - Resume home Methadone dose   Microcytic anemia: Fe 5, TIBC 360, % Sat 1%, Ferritin 7. Patient's H/H dropped to 6.3/22 2 days ago. Got 1 unit PRBC. Hgb stable today at 7.9. - IV Fe yesterday - Continue to monitor  FEN: Full liquids  DVT PPx: Lovenox  Dispo: Likely discharge tomorrow  The patient does not have a current PCP (No Pcp Per Patient) and does need an [facility] hospital follow-up appointment after discharge.  The patient does not have transportation limitations that hinder transportation to clinic appointments.  .Services Needed at time of discharge: Y = Yes, Blank = No PT:   OT:   RN:   Equipment:   Other:     LOS: 4 days   Maryellen Pile, MD [facility] PGY-1 [phone] 02/19/2015, 8:24 AM

## 2015-02-19 NOTE — Discharge Summary (Signed)
Name: Krystal [name] MRN: [id number] DOB: 08-17-1960 54 y.o. PCP: No Pcp Per Patient  Date of Admission: 02/15/2015  4:48 AM Date of Discharge: 02/20/2015 Attending Physician: Annia Belt, MD Discharge Diagnosis: Principal Problem:   Intra-abdominal abscess Active Problems:   Crohn's colitis   Long-term current use of methadone for opiate dependence   Partial small bowel obstruction   Malnutrition of moderate degree   Iron deficiency anemia   History of elevated glucose  Discharge Medications:   Medication List    TAKE these medications        amoxicillin-clavulanate 500-125 MG per tablet  Commonly known as:  AUGMENTIN  Take 1 tablet (500 mg total) by mouth 2 (two) times daily.     feeding supplement Liqd  Take 1 Container by mouth 3 (three) times daily between meals.     methadone 10 MG/ML solution  Commonly known as:  DOLOPHINE  Take 4 mLs (40 mg total) by mouth daily.     polyethylene glycol packet  Commonly known as:  MIRALAX / GLYCOLAX  Take 17 g by mouth daily.     predniSONE 10 MG tablet  Commonly known as:  DELTASONE  Take 2 pills on day 1 then 1 pill daily     promethazine 12.5 MG tablet  Commonly known as:  PHENERGAN  Take 1 tablet (12.5 mg total) by mouth every 6 (six) hours as needed for nausea or vomiting.     saccharomyces boulardii 250 MG capsule  Commonly known as:  FLORASTOR  Take 1 capsule (250 mg total) by mouth 2 (two) times daily.     simethicone 80 MG chewable tablet  Commonly known as:  MYLICON  Chew 1 tablet (80 mg total) by mouth every 6 (six) hours as needed for flatulence.        Disposition and follow-up:   Krystal [name] was discharged from [facility] in Stable condition.  At the hospital follow up visit please address:  1.  Krystal [name] was found to have intraabdominal abscesses. Needs follow up with IR for management of drains and follow up CT scan. Please assess the need for continuation  of the Augmentin and her nutritional status. Follows up with Dr. Cheryll Cockayne for further surgical evaluation.  2.  Labs / imaging needed at time of follow-up: CT abdomen   3.  Pending labs/ test needing follow-up: None  Follow-up Appointments: Follow-up Information    Follow up with Cheryll Cockayne, MD On 02/25/2015.   Specialty:  Surgical Oncology   Why:  2:15 PM   Contact information:   [place] [address] [phone]       Follow up with WAGNER, JAIME, DO.   Specialty:  Interventional Radiology   Contact information:   [address] [place] [address] [phone]       Discharge Instructions: Discharge Instructions    Diet - low sodium heart healthy    Complete by:  As directed      Increase activity slowly    Complete by:  As directed            Consultations: Treatment Team:  Ronald Lobo, MD Alphonsa Overall, MD  Procedures Performed:  Ct Abdomen Pelvis W Contrast  02/15/2015   CLINICAL DATA:  Abdominal pain, left lower quadrant.  EXAM: CT ABDOMEN AND PELVIS WITH CONTRAST  TECHNIQUE: Multidetector CT imaging of the abdomen and pelvis was performed using the  standard protocol following bolus administration of intravenous contrast.  CONTRAST:  46mL OMNIPAQUE IOHEXOL 300 MG/ML  SOLN  COMPARISON:  11/13/2014  FINDINGS: Lower chest and abdominal wall:  No contributory findings.  Hepatobiliary: No focal liver abnormality.Chronic dilation of the common bile duct to 8 mm. No visible calcified choledocholithiasis. Liver function tests are pending.  Pancreas: Stable mild prominence of the main pancreatic duct.  Spleen: Unremarkable.  Adrenals/Urinary Tract: Negative adrenals. No hydronephrosis or stone. Too small to characterize low-density in the lower pole left kidney, stable and statistically a cyst. Unremarkable bladder.  Reproductive:Hysterectomy and possible oophorectomies.  Stomach/Bowel: Multi focal skip  areas of bowel inflammation with wall thickening from submucosal edema and hypervascular mucosa. Findings consistent with Crohn's disease. 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 These are marked in the right abdomen on image 55 series 2. There are 5 distinct fluid collections with rim enhancement, some with internal gas, consistent with abscess:  1. Isolated right pericolic gutter at 15 mm maximal diameter. 2. Rectovaginal recess at 42 mm diameter. 3. Posterior to the bladder is a flat collection measuring 38 mm in width by 12 mm in thickness. 4. Ventral to this collection is a discrete appearing bilobed collection with small communicating channel. The upper collection measures 53 mm, the lower 40 mm. 5. Ventral to this collection is a discrete appearing collection which measures 49 x 14 mm axial dimension. 6. 7. Intermittent areas of small bowel distention compatible with partial obstruction.  Vascular/Lymphatic: No acute vascular abnormality. Reactive mesenteric adenopathy.  Musculoskeletal: No acute abnormalities.  IMPRESSION: 1. Crohn's with progressive penetrating disease and multiloculated pelvic and right flank abscess, as described above. 2. Chronically dilated common bile duct. Correlate with pending liver function tests.   Electronically Signed   By: Monte Fantasia M.D.   On: 02/15/2015 07:25   Ct Image Guided Fluid Drain By Catheter  02/15/2015   CLINICAL DATA:  54 year old female with a history of Crohn's disease, presents with multiloculated abscess of the abdomen pelvis.  She has been referred for evaluation for drain placement.  EXAM: CT GUIDED ABSCESS DRAIN BIOPSY OF PELVIC ABSCESS AND LEFT LOWER QUADRANT ABDOMINAL ABSCESS  ANESTHESIA/SEDATION: 3.0  Mg IV Versed; 150 mcg IV Fentanyl  Total Moderate Sedation Time: 45 minutes.  PROCEDURE: The procedure risks, benefits, and alternatives were explained to the patient. Questions regarding the  procedure were encouraged and answered. The patient understands and consents to the procedure.  Patient was first position in prone position on CT gantry table and a CT of the abdomen pelvis was performed for planning purposes.  Safe angle approach was determined for right trans gluteal access to pelvic abscess, and the patient is prepped and draped in the usual sterile fashion.  The right gluteal region was prepped with chlorhexidinein a sterile fashion, and a sterile drape was applied covering the operative field. A sterile gown and sterile gloves were used for the procedure. Local anesthesia was provided with 1% Lidocaine.  Once the patient is prepped and draped sterilely, 1% lidocaine was used for local anesthesia. A small skin incision was made with 11 blade scalpel, and a trocar needle was advanced safely into pelvic abscess using CT guidance. We confirmed needle tip position with aspiration of purulent material and was CT imaging.  Using modified Seldinger technique, a 10 French pigtail catheter drain was placed. Approximately 60 cc of highly viscous purulent fluid was aspirated. A sample sent to the lab.  The catheter pigtail was locked, and the catheter was sutured in position. A sterile bandage was placed.  The patient was then turned in supine position for drainage of a left lower quadrant abscess.  The left lower quadrant was prepped with chlorhexidinein a sterile fashion, and a sterile drape was applied covering the operative field. A sterile gown and sterile gloves were used for the procedure. Local anesthesia was provided with 1% Lidocaine.  Once the patient is prepped and draped sterilely, 1% lidocaine was used for local anesthesia. A small stab incision was made with 11 blade scalpel, and a trocar needle was advanced with CT guidance into fluid collection of the left lower quadrant. We confirmed position with aspiration of fluid and with CT imaging.  Using modified Seldinger technique, a 10 French  pigtail catheter drain was placed into the left lower quadrant fluid collection. Approximately 50 cc of turbid thin fluid was aspirated. Drainage bag was placed. Catheter was sutured in position and a sterile dressing was placed.  The patient tolerated the procedure well and remained hemodynamically stable throughout.  No complications were encountered and no significant blood loss was encountered.  FINDINGS: Initial CT imaging in the prone position demonstrates abscess within the pelvis, similar in size and location to prior cross-sectional imaging.  Images during the case demonstrate safe placement of 10 French catheter into pelvic abscess. 60 cc of viscous purulent fluid was removed.  In knee supine position, CT image demonstrates fluid collection of the left lower quadrant (images stored in PACs are mislabeled and reversed).  After placement of 10 French catheter, there is significant decompression of this fluid collection. 50 cc of turbid thin fluid removed.  IMPRESSION: Status post placement of right trans gluteal 10 French pigtail catheter into pelvic abscess, as well as percutaneous 10 French catheter placement into left lower quadrant fluid collection. Sample was sent from each collection to the lab for analysis.  Signed,  Dulcy Fanny. Earleen Newport, DO  [facility]  [facility]   Electronically Signed   By: Corrie Mckusick D.O.   On: 02/15/2015 15:55   Admission HPI: Mrs. Balboni is a 54 year old caucasian female with a PMH of Crohn's Disease and SBO presents to [facility] ED today with 1 day history of abdominal pain. Patient reports she was in her usual state of health until around 11 PM last night she experienced sudden onset LLQ abdominal pain. She describes the pain as sharp and stabbing, >10/10. Pain is worse with any movement. Does endorse nausea but denies any emesis. Last BM was yesterday afternoon. She is on chronic Miralax with daily watery BMs. Reports subjective  fever and chills that started this morning. Has chronic abdominal bloating. Denies any blood in her stool or melena. Denies any lightheadedness, dizziness, syncope, chest pain or dysuria.   She was admitted in June 2016 with a partial SBO. She was medically managed and discharged with a steroid taper. She is followed by Dr. Cristina Gong (GI) for her Crohn's and was kept on 10 mg prednisone chronically following June admission. No other medications for her Crohn's. She is on Methadone due to chronic severe abdominal pain due to her flare ups. She was scheduled to see Dr. Alphonsa Overall later this month for possible elective small bowel resection. She has quit smoking since her last admission, was previously smoking 2 PPD.   CT Abdomen done in the ED with multiloculated pelvic and right flank abscesses as well and intermittent areas of small  bowel distention compatible with partial obstruction. Received Cipro and Flagyl in the ED.   Evaluated by surgery and IR. Held NPO, NG drainage, stopped Cipro and Flagyl and started on Zosyn. Will go for CT guided drainage later today.   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 Loughridge was found to have multiple intraabdominal abscesses via CT scan. She was afebrile on arrival but spiked a fever to 102.4 with a  WBC of 33. She initially started on Cirpo and Flagyl but was switched to Zosyn per surgery. Blood cultures were drawn but this was done after receiving the first dose of Cipro in the ED. IR performed CT guided abscess drainage x2. She had a right transgluteal drain placed in to a right pelvic abscess and a left lower quadrant drain  placed. She was held NPO and NG tube was placed. She was started on Solumedrol 60 mg IV with a 10 mg daily taper. She is on Methadone for her chronic pain. Her pain was managed with Dilaudid 2 mg IV q2hr while he was held NPO. Blood cultures showed no growth final. Pelvic abscess culture grew moderate microaerophilic streptococci with no susceptibilities available. LLQ abscess culture with no predominant organisms. Her Hgb dropped from her baseline of 10 to 6.3 on 9/12. She received 1 unit of PRBC and Hgb peaked to 8.2 and remained stable during the rest of her hospitalization. Discharge Hgb 7.9. She was found to have reactive thrombocytosis related to Fe deficiency with a Ferratin of 7. She received a 510 mg dose of parenteral iron. A PICC line was placed and TPN started. She began to have non-bloody bowel movements and NG tube was stopped. Her diet was resumed and she was able to tolerate a soft diet with breeze nutritional supplements. ABX were switched to Augmentin (initially Cipro and Flagyl but concern for disulfiram reaction with her home methadone suspension containing ethanol). Stopped dilaudid and put her back on her home Methadone dose. As she was able to take in nutrition enterally, surgery did not feel that continued TPN outpatient was necessary and it was stopped and PICC line removed. Surgery did not feel that she was a good surgical candidate and was outside the scope of their expertise. She will follow up with [facility] for a second surgical opinion. Follow up with IR for management of her surgical drains and follow up CT scan. She was discharged with a taper to her home Prednisone 10 mg daily maintained dose as well as a 21 day course of Augmentin to be re-evaluated by IR for resolution of her abscesses.   Moderate Malnutrition: Albumin was 2.8 on admission. Prealbumin initially 20.5 but dropped to 13.7 on repeat. She was able to tolerate a soft diet and breeze nutritional  supplements. Discharge with instructions to maintaine a high protein diet and continue the supplements.   History of elevated glucose: Elevated BGs on previous admission in June with ranging from 107-273. No history of DM. A1c was 5.6.   Methadone Dependence: Patient on Methadone 43 mg PO daily for chronic abdominal pain 2/2 Crohn's Disease. Managed her pain Dilaudid while she was NPO and transitioned back to Methadone once she  was able to tolerate PO. Resume home Methadone and follow up with Methadone clinic at discharge.   Microcytic anemia: Fe 5, TIBC 360, % Sat 1%, Ferritin 7. Patient had an acute drop in Hgb to 6.3 during admission. Received 1 unit of PRBC. Hgb remained stable around 8 during the rest of her hospitalization. Discharge Hgb 7.9. She received one dose of 510 mg parenteral iron.    Discharge Vitals:   BP 99/54 mmHg  Pulse 67  Temp(Src) 98 F (36.7 C) (Oral)  Resp 18  Ht 5\' 2"  (1.575 m)  Wt 114 lb 3.2 oz (51.8 kg)  BMI 20.88 kg/m2  SpO2 95%  Discharge Labs:  No results found for this or any previous visit (from the past 24 hour(s)).  Signed: Maryellen Pile, MD 02/23/2015, 10:12 AM

## 2015-02-19 NOTE — Progress Notes (Signed)
(  no charge)  Discussed mgt w/ Dr. Charlynn Grimes and Saverio Danker.  I'd favor another day in the hospital w/ a trial of a low-residue diet to see if pt can be sent home WITHOUT need for TNA, which would be safer and more cost effective IF it is possible for pt.  Would also recheck WBC tomorrow to assess tolerance of transition to different, oral antbx regimen.  Cleotis Nipper, M.D. Pager [phone] If no answer or after 5 PM call [phone]

## 2015-02-19 NOTE — Progress Notes (Signed)
Patient ID: Krystal [name], female   DOB: 09/17/1960, 54 y.o.   MRN: [id number] Medicine attending: I examined this patient this morning together with resident physician Dr. Maryellen Pile. She is in good spirits. Clinically improving over the last 24-48 hours. She remains afebrile. She is now having multiple loose stools daily. Episodic pain but nothing of the intensity as when she presented. Feculent material draining from the right posterior drain. Serous fluid from the right lower quadrant abdominal drain. Abdomen moderately distended, tympanitic, no high-pitched bowel sounds. Mild tenderness. White count coming down with drainage of abscesses and tapering of steroids. Minimal change in hemoglobin currently 7.9. Discharge planning in progress. After discussion with Gen. surgery and gastroenterology, we will transition her to oral antibiotics with Cipro and Flagyl. Advanced to a full liquid diet with nutritional supplements and attempt to avoid parenteral nutrition at this time. Follow-up with general surgery and gastroenterology see him after discharge and with interventional radiology to manage the drains which will be left in place for now. Anticipate an interim CT scan at time of follow-up with IR. Anticipate discharge tomorrow.

## 2015-02-19 NOTE — Progress Notes (Signed)
Patient ID: Krystal [name], female   DOB: 1960/06/17, 54 y.o.   MRN: [id number]    Subjective: Pt feels better today, but just having some gas pains.  Tolerating clear liquids very well with no nausea.  Having some diarrhea.  Objective: Vital signs in last 24 hours: Temp:  [97.8 F (36.6 C)-98.6 F (37 C)] 97.8 F (36.6 C) (09/14 0327) Pulse Rate:  [57-67] 58 (09/14 0327) Resp:  [18] 18 (09/14 0327) BP: (103-125)/(62-71) 125/70 mmHg (09/14 0327) SpO2:  [96 %-99 %] 99 % (09/14 0327) Weight:  [52.4 kg (115 lb 8.3 oz)] 52.4 kg (115 lb 8.3 oz) (09/14 0327) Last BM Date: 02/18/15  Intake/Output from previous day: 09/13 0701 - 09/14 0700 In: 830 [P.O.:220; I.V.:610] Out: 25 [Drains:25] Intake/Output this shift:    PE: Abd: soft, less tender, right transgluteal drain with stool appearing output.  Left sided drain has cleared up more today and is now more serosang in nature.  +BS, still somewhat bloated  Lab Results:   Recent Labs  02/18/15 0421 02/19/15 0550  WBC 20.0* 11.1*  HGB 8.2* 7.9*  HCT 26.7* 25.9*  PLT 404* 408*   BMET  Recent Labs  02/18/15 0421 02/19/15 0550  NA 143 139  K 3.4* 3.8  CL 108 106  CO2 28 27  GLUCOSE 91 136*  BUN 17 9  CREATININE 0.95 0.73  CALCIUM 9.1 8.7*   PT/INR No results for input(s): LABPROT, INR in the last 72 hours. CMP     Component Value Date/Time   NA 139 02/19/2015 0550   K 3.8 02/19/2015 0550   CL 106 02/19/2015 0550   CO2 27 02/19/2015 0550   GLUCOSE 136* 02/19/2015 0550   BUN 9 02/19/2015 0550   CREATININE 0.73 02/19/2015 0550   CALCIUM 8.7* 02/19/2015 0550   PROT 5.8* 02/18/2015 0421   ALBUMIN 2.3* 02/18/2015 0421   AST 13* 02/18/2015 0421   ALT 10* 02/18/2015 0421   ALKPHOS 51 02/18/2015 0421   BILITOT 0.4 02/18/2015 0421   GFRNONAA >60 02/19/2015 0550   GFRAA >60 02/19/2015 0550   Lipase     Component Value Date/Time   LIPASE 15* 02/15/2015 0315       Studies/Results: No results  found.  Anti-infectives: Anti-infectives    Start     Dose/Rate Route Frequency Ordered Stop   02/15/15 2130  ciprofloxacin (CIPRO) IVPB 400 mg  Status:  Discontinued     400 mg 200 mL/hr over 60 Minutes Intravenous Every 12 hours 02/15/15 0920 02/15/15 1117   02/15/15 1400  piperacillin-tazobactam (ZOSYN) IVPB 3.375 g     3.375 g 12.5 mL/hr over 240 Minutes Intravenous 3 times per day 02/15/15 1200     02/15/15 0745  ciprofloxacin (CIPRO) IVPB 400 mg     400 mg 200 mL/hr over 60 Minutes Intravenous  Once 02/15/15 0736 02/15/15 1109   02/15/15 0745  metroNIDAZOLE (FLAGYL) IVPB 500 mg  Status:  Discontinued     500 mg 100 mL/hr over 60 Minutes Intravenous  Once 02/15/15 0736 02/15/15 1117       Assessment/Plan   1. Crohn's disease with multiple intra-abdominal fluid collections and EE fistulas  -cont with perc drains to help resolve these abscesses -she has significant inflammation and infection as this time.  She needs to be maximally managed for her crohn's disease prior to consideration of surgery.  If she fails to improve with medical management, then surgery can be considered. -advance to full liquids -TNA, but  can likely start to wean this, despite just starting it yesterday.  Cont Breeze TID -cont abx therapy, Zosyn D4, will likely need at least 14 days abx therapy, but may need closer towards 21 days given extent of infection. -she will need follow up with IR drain clinic -[place] arranged for drain care.  Patient does NOT need TNA at home. -we will have her follow up with Dr. Leighton Ruff in our office after discharge and she will need to be closely followed by GI as well. Anemia, likely of chronic disease -hgb 7.9 DVT Prophylaxis -SCDs/defer chemical to primary service  LOS: 4 days    Shalah Estelle E 02/19/2015, 9:08 AM Pager: [phone]

## 2015-02-19 NOTE — Care Management Note (Signed)
Case Management Note  Patient Details  Name: Krystal [name] MRN: [id number] Date of Birth: Oct 23, 1960  Subjective/Objective:                    Action/Plan: Confirmed with Saverio Danker PA , patient is not going home on TPN . Patient will still have a home health RN for drain care. Patient states her husband and son are willing to assist with drains at home also . Face sheet information confirmed with patient .   Expected Discharge Date:                  Expected Discharge Plan:  [place]  In-House Referral:     Discharge planning Services  CM Consult  Post Acute Care Choice:  Home Health Choice offered to:  Patient  DME Arranged:    DME Agency:     HH Arranged:  RN [place] Agency:  [place]  Status of Service:  Completed, signed off  Medicare Important Message Given:    Date Medicare IM Given:    Medicare IM give by:    Date Additional Medicare IM Given:    Additional Medicare Important Message give by:     If discussed at [place] of Stay Meetings, dates discussed:    Additional Comments:  Marilu Favre, RN 02/19/2015, 8:43 AM

## 2015-02-19 NOTE — Progress Notes (Signed)
[place] NOTE  Pharmacy Consult for TPN Indication: SBO and Prolonged NPO status  Allergies  Allergen Reactions  . Sulfa Antibiotics Rash    Patient Measurements: Height: 5\' 2"  (157.5 cm) Weight: 115 lb 8.3 oz (52.4 kg) IBW/kg (Calculated) : 50.1  Vital Signs: Temp: 97.8 F (36.6 C) (09/14 0327) Temp Source: Oral (09/14 0327) BP: 125/70 mmHg (09/14 0327) Pulse Rate: 58 (09/14 0327) Intake/Output from previous day: 09/13 0701 - 09/14 0700 In: 830 [P.O.:220; I.V.:610] Out: 25 [Drains:25] Intake/Output from this shift:    Labs:  Recent Labs  02/17/15 1815 02/18/15 0421 02/19/15 0550  WBC 19.0* 20.0* 11.1*  HGB 7.6* 8.2* 7.9*  HCT 25.2* 26.7* 25.9*  PLT 319 404* 408*     Recent Labs  02/18/15 0421 02/19/15 0550  NA 143 139  K 3.4* 3.8  CL 108 106  CO2 28 27  GLUCOSE 91 136*  BUN 17 9  CREATININE 0.95 0.73  CALCIUM 9.1 8.7*  MG 2.0 2.0  PHOS 4.1 2.9  PROT 5.8*  --   ALBUMIN 2.3*  --   AST 13*  --   ALT 10*  --   ALKPHOS 51  --   BILITOT 0.4  --   PREALBUMIN 13.7*  --   TRIG 205*  --    Estimated Creatinine Clearance: 65.1 mL/min (by C-G formula based on Cr of 0.73).    Recent Labs  02/18/15 2335 02/19/15 0331 02/19/15 0824  GLUCAP 130* 111* 113*      Insulin Requirements in the past 24 hours:  4 units SSI  Nutritional Goals:  1500-1700 kcal, 75-90g protein per RD assessment 9/12  Current Nutrition:  Clinimix E 5/15 at 4mL/hr + IVFE 20% at 77mL/hr- this will provide 48g protein and 1162 kcal per day.   Assessment: 54 YO female presents w/ LLQ pain, nausea, and abd distension. Hx of Crohn's and bowel obstruction. Zosyn was started for R pelvic abscesses. Pt reports no issues up until the night of 9/9 when she started having abdominal pain. Pharmacy consulted to start TPN for SBO and presumed prolonged NPO status. Since patient had poor diet only for a few days PTA, decision was made to wait until day 5 of being NPO  prior to starting TPN. Also, PICC could not be placed until blood cultures were negative x48h. Surgery does plan to take to surgery but is waiting days to weeks for current abscess/inflammatory process to improve.  Surgeries/Procedures: pending- per surgery notes, patient's   GI: Hx of Crohn's disease. Abdominal pain has greatly improved over last couple days. No nausea with NGT clamped. Passing some flatus and feels like she needs to have a BM. Right drain with dark bloody output. Prealbumin fell from 20.5 to 13.7.    Endo: No hx of DM. AM glucose ok. Methylprednisolone taper started  Lytes: Lytes wnl. Phos and Mg ok   Renal: SCr 0.73, CrCl ~65 ml/min. K 3.8, Phos2.9, mag 2, CorCa ~10.2  Pulm: RA  Cards: BP soft and HR ok (can be brady). No CV meds  Hepatobil: AST/ALT low at 13/10, tbili and alk phos nml. Albumin low at 2.3. Trigs elevated at baseline at 205  Neuro: dilaudid prn  Heme: Tsat 1%, ferritin 7. Hgb and plts dropped. 1 unit blood given 9/12, Feraheme x1 given 9/13, to have a repeat dose given 9/20  ID: Day #5 of zosyn for crohn's colits with right pelvic abscesses. s/p drainage and drain placement of abscesses. Afebrile, WBC dropped  significantly to 11.1 (she remains on steroids). Will likely switch to Augmentin to go home  9/10 Blood cx: NGTD 9/10 abscess: mod microaerophilic strep  8/88 Zosyn >>  Best Practices: SCDs  TPN Access: PICC placed 9/12  TPN start date: 9/13>>  Plan:  - allow Clinimix E 5/15 at 22mL/hr + IVFE 20% at 12mL/hr to run out (NO REDUCTION IN RATE NEEDED) and then will NOT hang a bag tonight per discussion with Saverio Danker, PA from general surgery - will d/c CBGs and sensitive SSI q4h after TPN bag expires tonight  - TPN labs discontinued - allow MD to dictate any changed to MIVF  Per discussion with Claiborne Billings, ok for pharmacy to sign off from this consult. Please re-consult or contact the department if needed.   Analyssa Downs D. Stefhanie Kachmar, PharmD,  BCPS Clinical Pharmacist Pager: [phone] 02/19/2015 9:20 AM

## 2015-02-20 LAB — CULTURE, ROUTINE-ABSCESS: SPECIAL REQUESTS: NORMAL

## 2015-02-20 LAB — CULTURE, BLOOD (ROUTINE X 2)
CULTURE: NO GROWTH
Culture: NO GROWTH

## 2015-02-20 LAB — GLUCOSE, CAPILLARY
GLUCOSE-CAPILLARY: 103 mg/dL — AB (ref 65–99)
Glucose-Capillary: 78 mg/dL (ref 65–99)
Glucose-Capillary: 80 mg/dL (ref 65–99)

## 2015-02-20 MED ORDER — AMOXICILLIN-POT CLAVULANATE 500-125 MG PO TABS: 500.0000 mg | ORAL_TABLET | Freq: Two times a day (BID) | ORAL | Status: AC

## 2015-02-20 MED ORDER — SACCHAROMYCES BOULARDII 250 MG PO CAPS: 250.0000 mg | ORAL_CAPSULE | Freq: Two times a day (BID) | ORAL | Status: AC

## 2015-02-20 MED ORDER — PREDNISONE 10 MG PO TABS: ORAL_TABLET | ORAL | Status: AC

## 2015-02-20 MED ORDER — SIMETHICONE 80 MG PO CHEW: 80.0000 mg | CHEWABLE_TABLET | Freq: Four times a day (QID) | ORAL | Status: AC | PRN

## 2015-02-20 MED ORDER — PROMETHAZINE HCL 12.5 MG PO TABS: 12.5000 mg | ORAL_TABLET | Freq: Four times a day (QID) | ORAL | Status: AC | PRN

## 2015-02-20 NOTE — Progress Notes (Signed)
Patient ID: Krystal [name], female   DOB: February 21, 1961, 54 y.o.   MRN: [id number]    Subjective: Pt feels well this morning.  Still with a little gas pain.  Tolerating low fiber diet well.  Still having BMs  Objective: Vital signs in last 24 hours: Temp:  [97.9 F (36.6 C)-98.4 F (36.9 C)] 97.9 F (36.6 C) (09/15 0451) Pulse Rate:  [65-71] 65 (09/15 0451) Resp:  [18] 18 (09/15 0451) BP: (108-124)/(65-75) 124/75 mmHg (09/15 0451) SpO2:  [96 %-100 %] 99 % (09/15 0451) Weight:  [51.8 kg (114 lb 3.2 oz)] 51.8 kg (114 lb 3.2 oz) (09/15 0451) Last BM Date: 02/19/15  Intake/Output from previous day: 09/14 0701 - 09/15 0700 In: 10  Out: 65 [Drains:65] Intake/Output this shift:    PE: Abd: soft, less tender, drain on right still with feculent appearing output, and left sided with serous drainage.  Lab Results:   Recent Labs  02/18/15 0421 02/19/15 0550  WBC 20.0* 11.1*  HGB 8.2* 7.9*  HCT 26.7* 25.9*  PLT 404* 408*   BMET  Recent Labs  02/18/15 0421 02/19/15 0550  NA 143 139  K 3.4* 3.8  CL 108 106  CO2 28 27  GLUCOSE 91 136*  BUN 17 9  CREATININE 0.95 0.73  CALCIUM 9.1 8.7*   PT/INR No results for input(s): LABPROT, INR in the last 72 hours. CMP     Component Value Date/Time   NA 139 02/19/2015 0550   K 3.8 02/19/2015 0550   CL 106 02/19/2015 0550   CO2 27 02/19/2015 0550   GLUCOSE 136* 02/19/2015 0550   BUN 9 02/19/2015 0550   CREATININE 0.73 02/19/2015 0550   CALCIUM 8.7* 02/19/2015 0550   PROT 5.8* 02/18/2015 0421   ALBUMIN 2.3* 02/18/2015 0421   AST 13* 02/18/2015 0421   ALT 10* 02/18/2015 0421   ALKPHOS 51 02/18/2015 0421   BILITOT 0.4 02/18/2015 0421   GFRNONAA >60 02/19/2015 0550   GFRAA >60 02/19/2015 0550   Lipase     Component Value Date/Time   LIPASE 15* 02/15/2015 0315       Studies/Results: No results found.  Anti-infectives: Anti-infectives    Start     Dose/Rate Route Frequency Ordered Stop   02/19/15 2200   amoxicillin-clavulanate (AUGMENTIN) 500-125 MG per tablet 500 mg     500 mg Oral 2 times daily 02/19/15 1518     02/19/15 1000  ciprofloxacin (CIPRO) tablet 500 mg  Status:  Discontinued     500 mg Oral 2 times daily 02/19/15 0945 02/19/15 1518   02/19/15 1000  metroNIDAZOLE (FLAGYL) tablet 500 mg  Status:  Discontinued     500 mg Oral Every 12 hours 02/19/15 0945 02/19/15 1518   02/15/15 2130  ciprofloxacin (CIPRO) IVPB 400 mg  Status:  Discontinued     400 mg 200 mL/hr over 60 Minutes Intravenous Every 12 hours 02/15/15 0920 02/15/15 1117   02/15/15 1400  piperacillin-tazobactam (ZOSYN) IVPB 3.375 g  Status:  Discontinued     3.375 g 12.5 mL/hr over 240 Minutes Intravenous 3 times per day 02/15/15 1200 02/19/15 0945   02/15/15 0745  ciprofloxacin (CIPRO) IVPB 400 mg     400 mg 200 mL/hr over 60 Minutes Intravenous  Once 02/15/15 0736 02/15/15 1109   02/15/15 0745  metroNIDAZOLE (FLAGYL) IVPB 500 mg  Status:  Discontinued     500 mg 100 mL/hr over 60 Minutes Intravenous  Once 02/15/15 0736 02/15/15 1117  Assessment/Plan 1. Crohn's disease with multiple intra-abdominal fluid collections and EE fistulas  -cont with perc drains to help resolve these abscesses -cont low fiber diet -TNA, but can likely start to wean this, despite just starting it yesterday. Cont Breeze TID -cont abx therapy, likely needs close to 21 days total -she will need follow up with IR drain clinic -[place] arranged for drain care. -we will have her follow up with Dr. Leighton Ruff in our office after discharge and she will need to be closely followed by GI as well. -ok for DC home from our standpoint Anemia, likely of chronic disease DVT Prophylaxis -SCDs/defer chemical to primary service    ADDENDUM: I have spoken to Dr. Leighton Ruff and her recommendation was that the patient needs to be medically managed and there is no role for surgical intervention at this time.  She said the only reason to operate  would be a frank perforation or a complete SBO from stricture.  She states that the drains can be followed by radiology in their clinic and she can follow up with GI for management.  If her crohn's is more complicated than able to be managed here, she may need referral to a tertiary medical center.  She does not recommend surgical follow up.  LOS: 5 days    Marco Raper E 02/20/2015, 8:47 AM Pager: [phone]

## 2015-02-20 NOTE — Progress Notes (Signed)
AVS discharge instructions were reviewed with patient. Patient was told about the prescriptions she had to pickup at her pharmacy. Patient was also shown how to drain, measure and record and care for drain to right buttocks and the drain to left abdomen. Patient stated that she did not have any questions. Patient stated that her brother would pick her up at 1830 today.

## 2015-02-20 NOTE — Progress Notes (Signed)
Pt has questions about whether or not her home health has been scheduled with [facility].  Spoke with pt and confirmed with her that it has, indeed, been scheduled.  Pt for dc today; [facility] will call her within 24-48h of dc.    Reinaldo Raddle, RN, BSN  Trauma/Neuro ICU Case Manager [phone]

## 2015-02-20 NOTE — Progress Notes (Signed)
Subjective: Krystal [name] is doing well this morning. She reports her diarrhea has improved. Had 1 soft BM overnight with no blood. Still sore around the drain site on the left lower abdomen but otherwise doing well. Tolerating more solid foods, states she ate chicken and mashed potatoes last night.   Objective: Vital signs in last 24 hours: Filed Vitals:   02/19/15 0327 02/19/15 1510 02/19/15 2053 02/20/15 0451  BP: 125/70 124/65 108/68 124/75  Pulse: 58 71 68 65  Temp: 97.8 F (36.6 C) 98.4 F (36.9 C) 98.1 F (36.7 C) 97.9 F (36.6 C)  TempSrc: Oral Oral Oral Oral  Resp: 18 18 18 18   Height:      Weight: 115 lb 8.3 oz (52.4 kg)   114 lb 3.2 oz (51.8 kg)  SpO2: 99% 100% 96% 99%   Weight change: -1 lb 5.2 oz (-0.6 kg)  Intake/Output Summary (Last 24 hours) at 02/20/15 1258 Last data filed at 02/20/15 1202  Gross per 24 hour  Intake    260 ml  Output     65 ml  Net    195 ml   Physical Exam GENERAL- alert, co-operative, appears as stated age, in distress HEENT- Atraumatic, normocephalic, PERRL, EOMI, oral mucosa appears moist CARDIAC- RRR, no murmurs, rubs or gallops. RESP- Moving equal volumes of air, and clear to auscultation bilaterally, no wheezes or crackles.  ABDOMEN- Abdomen is soft and non-tender, no guarding or rebound. Normal bowel sounds.  NEURO- No obvious Cr N abnormality EXTREMITIES- pulse 2+, symmetric, no pedal edema. SKIN- Warm, dry, No rash or lesion. PSYCH- Normal mood and affect, appropriate thought content and speech.  Lab Results: Basic Metabolic Panel:  Recent Labs Lab 02/18/15 0421 02/19/15 0550  NA 143 139  K 3.4* 3.8  CL 108 106  CO2 28 27  GLUCOSE 91 136*  BUN 17 9  CREATININE 0.95 0.73  CALCIUM 9.1 8.7*  MG 2.0 2.0  PHOS 4.1 2.9   Liver Function Tests:  Recent Labs Lab 02/15/15 0315 02/18/15 0421  AST 15 13*  ALT 10* 10*  ALKPHOS 57 51  BILITOT 0.3 0.4  PROT 6.3* 5.8*  ALBUMIN 2.8* 2.3*    Recent Labs Lab  02/15/15 0315  LIPASE 15*   CBC:  Recent Labs Lab 02/15/15 0925  02/18/15 0421 02/19/15 0550  WBC 32.9*  < > 20.0* 11.1*  NEUTROABS 27.7*  --  16.8*  --   HGB 7.7*  < > 8.2* 7.9*  HCT 26.3*  < > 26.7* 25.9*  MCV 71.9*  < > 73.6* 74.2*  PLT 353  < > 404* 408*  < > = values in this interval not displayed.  CBG:  Recent Labs Lab 02/19/15 1715 02/19/15 2039 02/19/15 2350 02/20/15 0447 02/20/15 0753 02/20/15 1125  GLUCAP 133* 107* 91 78 80 103*   Hemoglobin A1C:  Recent Labs Lab 02/15/15 0925  HGBA1C 5.6   Fasting Lipid Panel:  Recent Labs Lab 02/18/15 0421  TRIG 205*   Thyroid Function Tests: No results for input(s): TSH, T4TOTAL, FREET4, T3FREE, THYROIDAB in the last 168 hours. Coagulation:  Recent Labs Lab 02/15/15 0925  LABPROT 14.9  INR 1.15   Anemia Panel:  Recent Labs Lab 02/15/15 0925  FERRITIN 7*  TIBC 360  IRON 5*  RETICCTPCT 2.7   Urine Drug Screen: Drugs of Abuse     Component Value Date/Time   LABOPIA RESULTS UNAVAILABLE DUE TO INTERFERING SUBSTANCE* 11/14/2014 [place] DETECTED 11/14/2014 5329  LABBENZ NONE DETECTED 11/14/2014 0853   AMPHETMU RESULTS UNAVAILABLE DUE TO INTERFERING SUBSTANCE* 11/14/2014 0853   THCU RESULTS UNAVAILABLE DUE TO INTERFERING SUBSTANCE* 11/14/2014 0853   LABBARB NONE DETECTED 11/14/2014 0853    Alcohol Level: No results for input(s): ETH in the last 168 hours. Urinalysis:  Recent Labs Lab 02/15/15 0409  COLORURINE YELLOW  LABSPEC 1.019  PHURINE 5.5  GLUCOSEU NEGATIVE  HGBUR NEGATIVE  BILIRUBINUR NEGATIVE  KETONESUR NEGATIVE  PROTEINUR NEGATIVE  UROBILINOGEN 0.2  NITRITE NEGATIVE  LEUKOCYTESUR NEGATIVE    Micro Results: Recent Results (from the past 240 hour(s))  Culture, blood (routine x 2)     Status: None (Preliminary result)   Collection Time: 02/15/15  9:35 AM  Result Value Ref Range Status   Specimen Description BLOOD LEFT ANTECUBITAL  Final   Special  Requests   Final    BOTTLES DRAWN AEROBIC AND ANAEROBIC 10CC AER 5CC ANA   Culture NO GROWTH 4 DAYS  Final   Report Status PENDING  Incomplete  Culture, blood (routine x 2)     Status: None (Preliminary result)   Collection Time: 02/15/15  9:40 AM  Result Value Ref Range Status   Specimen Description BLOOD LEFT HAND  Final   Special Requests BOTTLES DRAWN AEROBIC ONLY 10CC  Final   Culture NO GROWTH 4 DAYS  Final   Report Status PENDING  Incomplete  Culture, routine-abscess     Status: None   Collection Time: 02/15/15  5:39 PM  Result Value Ref Range Status   Specimen Description ABSCESS PELVIS  Final   Special Requests Normal  Final   Gram Stain   Final    MODERATE WBC PRESENT,BOTH PMN AND MONONUCLEAR NO SQUAMOUS EPITHELIAL CELLS SEEN MODERATE GRAM POSITIVE COCCI IN CHAINS Performed at [facility]    Culture   Final    MODERATE MICROAEROPHILIC STREPTOCOCCI Note: Standardized susceptibility testing for this organism is not available. Performed at [facility]    Report Status 02/18/2015 FINAL  Final  Culture, routine-abscess     Status: None   Collection Time: 02/15/15  5:40 PM  Result Value Ref Range Status   Specimen Description ABSCESS LEFT LOWER ABDOMEN  Final   Special Requests Normal  Final   Gram Stain   Final    MODERATE WBC PRESENT, PREDOMINANTLY PMN NO SQUAMOUS EPITHELIAL CELLS SEEN NO ORGANISMS SEEN Performed at [facility]    Culture   Final    MULTIPLE ORGANISMS PRESENT, NONE PREDOMINANT Note: NO STAPHYLOCOCCUS AUREUS ISOLATED NO GROUP A STREP (S.PYOGENES) ISOLATED Performed at [facility]    Report Status 02/20/2015 FINAL  Final   Studies/Results: No results found. Medications: I have reviewed the patient's current medications. Scheduled Meds: . amoxicillin-clavulanate  500 mg Oral BID  . antiseptic oral rinse  7 mL Mouth Rinse BID  . enoxaparin (LOVENOX) injection  40 mg Subcutaneous Q24H  . feeding supplement  1  Container Oral TID BM  . ferumoxytol  510 mg Intravenous Weekly  . Influenza vac split quadrivalent PF  0.5 mL Intramuscular Tomorrow-1000  . methadone  45 mg Oral Daily  . [START ON 02/21/2015] predniSONE  20 mg Oral Q breakfast   And  . [START ON 02/22/2015] predniSONE  10 mg Oral Q breakfast  . saccharomyces boulardii  250 mg Oral BID   Continuous Infusions: . sodium chloride 35 mL/hr at 02/18/15 1733   PRN Meds:.acetaminophen, HYDROmorphone (DILAUDID) injection, promethazine, simethicone, sodium chloride Assessment/Plan: Principal Problem:  Intra-abdominal abscess Active Problems:   Crohn's colitis   Long-term current use of methadone for opiate dependence   Partial small bowel obstruction   Malnutrition of moderate degree   Iron deficiency anemia   History of elevated glucose  Crohn's disease with multiple Intraabdominal abscesses and EE fistulas: s/p percutaneous drainage x2 with drainage catheters in place. Diarrhea improved. Tolerating sold foods. Pain well controlled. Will not need TPN as an outpatient as she is tolerating PO intake. D/C PICC today. Will arrange for follow up with colorectal surgery at [facility].  - Left lower abdomen abscess culture with no predominant organisms - Pelvic abscess culture with moderate microaerophilic streptocooci, susceptibility testing unavailable - Started Cipro and Flagyl yesterday. Her home methadone suspension contains ethanol. Potential to have disulfram reaction. Will continue her on Augmentin.   - Phenergan 12.5 mg IV q4hr prn - Methadone home dose per pharmacy - Dilaudid 1 mg IV q4hr prn - Blood cultures NG x 4 days (drawn after Cipro given in ED) - Will transition to Prednisone 30 mg today and taper to her 10 mg daily home dose.  - Cotninue probiotics  - Appreciate GI's assistance  - Follow up with IR drain clinic - Likely discharge today   Moderate Malnutrition: Albumin 2.8 on admission with prealbumin 20.5. Repeat  prealbumin 13.7. Received 1 full dose of TPN but was ready for discharge and TPN stopped. Tolerating soft diet. Will D/C PICC today.   History of elevated glucose: Elevated BGs on previous admission in June with ranging from 107-273. No history of DM. - A1c 5.6.  Methadone Dependence: Patient on Methadone 43 mg PO daily for chronic abdominal pain 2/2 Crohn's Disease. - Dilaudid 1 gm IV q4hr prn - Resume home Methadone dose   Microcytic anemia: Fe 5, TIBC 360, % Sat 1%, Ferritin 7. Patient's H/H dropped to 6.3/22 2 days ago. Got 1 unit PRBC. Hgb stable today at 7.9. - IV Fe adminstered  FEN: Soft Diet  DVT PPx: Lovenox  Dispo: Likely discharge today  The patient does not have a current PCP (No Pcp Per Patient) and does need an [facility] hospital follow-up appointment after discharge.  The patient does not have transportation limitations that hinder transportation to clinic appointments.  .Services Needed at time of discharge: Y = Yes, Blank = No PT:   OT:   RN:   Equipment:   Other:     LOS: 5 days   Maryellen Pile, MD IMTS PGY-1 [phone] 02/20/2015, 12:58 PM

## 2015-02-20 NOTE — Discharge Instructions (Addendum)
Krystal [name], please continue to take the antibiotics (Agumentin) we have prescribed for you twice a day. Continue to use the Breeze supplements and eat a high protein diet avoiding foods such as corn that could cause issues with your Crohn's disease.   Flush your drains with 5cc three times a day. Follow up with the drain clinic. They will give you a call after discharge to schedule an appointment.  Follow up with Dr. Morton Stall on September 20th at 2:15 PM. The address in [address], [place]. [address] [employer].   If you have any issues please call our clinic at [phone].  Crohn Disease Crohn disease is a long-term (chronic) soreness and redness (inflammation) of the intestines (bowel). It can affect any portion of the digestive tract, from the mouth to the anus. It can also cause problems outside the digestive tract. Crohn disease is closely related to a disease called ulcerative colitis (together, these two diseases are called inflammatory bowel disease).  CAUSES  The cause of Crohn disease is not known. One Link Snuffer is that, in an easily affected person, the immune system is triggered to attack the body's own digestive tissue. Crohn disease runs in families. It seems to be more common in certain geographic areas and amongst certain races. There are no clear-cut dietary causes.  SYMPTOMS  Crohn disease can cause many different symptoms since it can affect many different parts of the body. Symptoms include:  Fatigue.  Weight loss.  Chronic diarrhea, sometime bloody.  Abdominal pain and cramps.  Fever.  Ulcers or canker sores in the mouth or rectum.  Anemia (low red blood cells).  Arthritis, skin problems, and eye problems may occur. Complications of Crohn disease can include:  Series of holes (perforation) of the bowel.  Portions of the intestines sticking to each other (adhesions).  Obstruction of the bowel.  Fistula formation, typically in the  rectal area but also in other areas. A fistula is an opening between the bowels and the outside, or between the bowels and another organ.  A painful crack in the mucous membrane of the anus (rectal fissure). DIAGNOSIS  Your caregiver may suspect Crohn disease based on your symptoms and an exam. Blood tests may confirm that there is a problem. You may be asked to submit a stool specimen for examination. X-rays and CT scans may be necessary. Ultimately, the diagnosis is usually made after a procedure that uses a flexible tube that is inserted via your mouth or your anus. This is done under sedation and is called either an upper endoscopy or colonoscopy. With these tests, the specialist can take tiny tissue samples and remove them from the inside of the bowel (biopsy). Examination of this biopsy tissue under a microscope can reveal Crohn disease as the cause of your symptoms. Due to the many different forms that Crohn disease can take, symptoms may be present for several years before a diagnosis is made. TREATMENT  Medications are often used to decrease inflammation and control the immune system. These include medicines related to aspirin, steroid medications, and newer and stronger medications to slow down the immune system. Some medications may be used as suppositories or enemas. A number of other medications are used or have been studied. Your caregiver will make specific recommendations. HOME CARE INSTRUCTIONS   Symptoms such as diarrhea can be controlled with medications. Avoid foods that have a laxative effect such as fresh fruit, vegetables, and dairy products. During flare-ups, you can rest  your bowel by refraining from solid foods. Drink clear liquids frequently during the day. (Electrolyte or rehydrating fluids are best. Your caregiver can help you with suggestions.) Drink often to prevent loss of body fluids (dehydration). When diarrhea has cleared, eat small meals and more frequently. Avoid food  additives and stimulants such as caffeine (coffee, tea, or chocolate). Enzyme supplements may help if you develop intolerance to a sugar in dairy products (lactose). Ask your caregiver or dietitian about specific dietary instructions.  Try to maintain a positive attitude. Learn relaxation techniques such as self-hypnosis, mental imaging, and muscle relaxation.  If possible, avoid stresses which can aggravate your condition.  Exercise regularly.  Follow your diet.  Always get plenty of rest. SEEK MEDICAL CARE IF:   Your symptoms fail to improve after a week or two of new treatment.  You experience continued weight loss.  You have ongoing cramps or loose bowels.  You develop a new skin rash, skin sores, or eye problems. SEEK IMMEDIATE MEDICAL CARE IF:   You have worsening of your symptoms or develop new symptoms.  You have a fever.  You develop bloody diarrhea.  You develop severe abdominal pain. MAKE SURE YOU:   Understand these instructions.  Will watch your condition.  Will get help right away if you are not doing well or get worse. Document Released: 03/03/2005 Document Revised: 10/08/2013 Document Reviewed: 01/30/2007 [facility] Patient Information 2015 [place], [place]. This information is not intended to replace advice given to you by your health care provider. Make sure you discuss any questions you have with your health care provider.

## 2015-02-21 ENCOUNTER — Other Ambulatory Visit (HOSPITAL_COMMUNITY): Payer: Self-pay | Admitting: Interventional Radiology

## 2015-02-21 ENCOUNTER — Other Ambulatory Visit: Payer: Self-pay | Admitting: General Surgery

## 2015-02-21 DIAGNOSIS — K651 Peritoneal abscess: Secondary | ICD-10-CM

## 2015-02-26 ENCOUNTER — Ambulatory Visit
Admission: RE | Admit: 2015-02-26 | Discharge: 2015-02-26 | Disposition: A | Discharge status: Home / Self Care | Payer: Managed Care, Other (non HMO) | Source: Ambulatory Visit | Attending: General Surgery | Admitting: General Surgery

## 2015-02-26 ENCOUNTER — Other Ambulatory Visit: Payer: Self-pay | Admitting: Interventional Radiology

## 2015-02-26 ENCOUNTER — Ambulatory Visit
Admission: RE | Admit: 2015-02-26 | Discharge: 2015-02-26 | Disposition: A | Discharge status: Home / Self Care | Payer: Managed Care, Other (non HMO) | Source: Ambulatory Visit | Attending: Interventional Radiology | Admitting: Interventional Radiology

## 2015-02-26 DIAGNOSIS — K651 Peritoneal abscess: Secondary | ICD-10-CM

## 2015-02-26 DIAGNOSIS — K50914 Crohn's disease, unspecified, with abscess: Secondary | ICD-10-CM

## 2015-02-26 MED ORDER — IOPAMIDOL (ISOVUE-300) INJECTION 61%
100.0000 mL | Freq: Once | INTRAVENOUS | Status: AC | PRN
  Administered 2015-02-26: 100 mL via INTRAVENOUS

## 2015-02-26 NOTE — Progress Notes (Signed)
Chief Complaint: Patient was seen in consultation today for follow up of multiple abdominopelvic abscesses s/p perc drainage at the request of Dr. Leighton Ruff  Referring Physician(s): Watts,John  History of Present Illness: Krystal [name] is a 54 y.o. female who has Crohn's colitis with multiple abscesses. She underwent image guided percutaneous drainage X2 on 9/10 with a LLQ drain and a (R)transgluteal drain to a pelvic abscess. She was discharged home on 9/15 on oral Augmentin. She is feeling much better. She is able to eat regular diet and is moving her bowels daily. She reports minimal pain, just some soreness at her drain sites. She has been flushing the drains as directed, not much output from the LLQ, but still some foul smelling output from the (R)TG drain She had a repeat CT this morning and is here for follow up and possible drain injection.  Past Medical History  Diagnosis Date  . SBO (small bowel obstruction) 11/2014  . Crohn's colitis     Past Surgical History  Procedure Laterality Date  . Abdominal hysterectomy    . Appendectomy    . Tonsillectomy      Allergies: Sulfa antibiotics  Medications: Prior to Admission medications   Medication Sig Start Date End Date Taking? Authorizing Provider  amoxicillin-clavulanate (AUGMENTIN) 500-125 MG per tablet Take 1 tablet (500 mg total) by mouth 2 (two) times daily. 02/20/15   Maryellen Pile, MD  feeding supplement, RESOURCE BREEZE, (RESOURCE BREEZE) LIQD Take 1 Container by mouth 3 (three) times daily between meals. Patient not taking: Reported on 02/15/2015 11/23/14   Riccardo Dubin, MD  methadone (DOLOPHINE) 10 MG/ML solution Take 4 mLs (40 mg total) by mouth daily. Patient taking differently: Take 43 mg by mouth daily.  11/22/14   Jones Bales, MD  polyethylene glycol (MIRALAX / GLYCOLAX) packet Take 17 g by mouth daily.    Historical Provider, MD  predniSONE (DELTASONE) 10 MG tablet Take 2 pills on day 1  then 1 pill daily 02/20/15   Maryellen Pile, MD  promethazine (PHENERGAN) 12.5 MG tablet Take 1 tablet (12.5 mg total) by mouth every 6 (six) hours as needed for nausea or vomiting. 02/20/15   Maryellen Pile, MD  saccharomyces boulardii (FLORASTOR) 250 MG capsule Take 1 capsule (250 mg total) by mouth 2 (two) times daily. 02/20/15   Maryellen Pile, MD  simethicone (MYLICON) 80 MG chewable tablet Chew 1 tablet (80 mg total) by mouth every 6 (six) hours as needed for flatulence. 02/20/15   Maryellen Pile, MD     No family history on file.  Social History   Social History  . Marital Status: Single    Spouse Name: N/A  . Number of Children: N/A  . Years of Education: N/A   Social History Main Topics  . Smoking status: Former Smoker    Quit date: 11/12/2014  . Smokeless tobacco: Never Used  . Alcohol Use: No  . Drug Use: No  . Sexual Activity: Not on file   Other Topics Concern  . Not on file   Social History Narrative    Review of Systems: A 12 point ROS discussed and pertinent positives are indicated in the HPI above.  All other systems are negative.  Review of Systems  Vital Signs: BP 97/49 mmHg  Pulse 91  Temp(Src) 98.5 F (36.9 C)  SpO2 96%  Physical Exam  Constitutional: She appears well-developed and well-nourished. No distress.  Cardiovascular: Normal rate, regular rhythm and normal heart sounds.  Pulmonary/Chest: Effort normal and breath sounds normal. No respiratory distress.  Abdominal: Soft. She exhibits distension. She exhibits no mass. There is no tenderness.  LLQ drain intact, site clean, scant serous output (R)TG drain intact, site clean, cloudy purulent/feculaent output in bag     Labs:  CBC:  Recent Labs  02/17/15 0330 02/17/15 1815 02/18/15 0421 02/19/15 0550  WBC 19.7* 19.0* 20.0* 11.1*  HGB 6.3* 7.6* 8.2* 7.9*  HCT 22.4* 25.2* 26.7* 25.9*  PLT 369 319 404* 408*    COAGS:  Recent Labs  02/15/15 0925  INR 1.15  APTT 26     BMP:  Recent Labs  02/15/15 0315 02/16/15 0407 02/18/15 0421 02/19/15 0550  NA 139 138 143 139  K 4.1 3.9 3.4* 3.8  CL 104 105 108 106  CO2 25 27 28 27   GLUCOSE 136* 148* 91 136*  BUN 11 9 17 9   CALCIUM 8.8* 8.8* 9.1 8.7*  CREATININE 0.80 0.75 0.95 0.73  GFRNONAA >60 >60 >60 >60  GFRAA >60 >60 >60 >60    LIVER FUNCTION TESTS:  Recent Labs  11/13/14 0258 11/14/14 0322 02/15/15 0315 02/18/15 0421  BILITOT 0.5 0.4 0.3 0.4  AST 20 20 15  13*  ALT 17 19 10* 10*  ALKPHOS 124 73 57 51  PROT 6.5 5.4* 6.3* 5.8*  ALBUMIN 2.7* 1.9* 2.8* 2.3*    Assessment: Crohn's colitis with multiple abscesses S/p perc drain 9/10 CT today shows resolution of fluid collection at LLQ drain, that drain was removed today without difficulty, sterile dressing applied. Additional multiple undrained fluid collections are improved, but remain. Large fluid collection in low pelvis where TG drain is positioned is essentially unchanged, therefore inadequately drained.   Plan:   SignedAscencion Dike 02/26/2015, 11:34 AM   Please refer to Dr.  Hyman Bower of this note for management and plan.

## 2015-02-27 ENCOUNTER — Other Ambulatory Visit: Payer: Self-pay | Admitting: Physician Assistant

## 2015-02-27 ENCOUNTER — Other Ambulatory Visit: Payer: Self-pay | Admitting: Radiology

## 2015-02-28 ENCOUNTER — Other Ambulatory Visit: Payer: Self-pay | Admitting: General Surgery

## 2015-02-28 ENCOUNTER — Ambulatory Visit (HOSPITAL_COMMUNITY)
Admission: RE | Admit: 2015-02-28 | Discharge: 2015-02-28 | Disposition: A | Discharge status: Home / Self Care | Payer: Managed Care, Other (non HMO) | Source: Ambulatory Visit | Attending: Interventional Radiology | Admitting: Interventional Radiology

## 2015-02-28 ENCOUNTER — Other Ambulatory Visit (HOSPITAL_COMMUNITY): Payer: Self-pay | Admitting: Interventional Radiology

## 2015-02-28 ENCOUNTER — Encounter (HOSPITAL_COMMUNITY): Payer: Self-pay

## 2015-02-28 DIAGNOSIS — K651 Peritoneal abscess: Secondary | ICD-10-CM

## 2015-02-28 DIAGNOSIS — Z4803 Encounter for change or removal of drains: Secondary | ICD-10-CM | POA: Diagnosis not present

## 2015-02-28 DIAGNOSIS — K50914 Crohn's disease, unspecified, with abscess: Secondary | ICD-10-CM | POA: Diagnosis not present

## 2015-02-28 MED ORDER — IOHEXOL 300 MG/ML  SOLN
50.0000 mL | Freq: Once | INTRAMUSCULAR | Status: DC | PRN
  Administered 2015-02-28: 10 mL
  Filled 2015-02-28: qty 50

## 2015-02-28 MED ORDER — MIDAZOLAM HCL 2 MG/2ML IJ SOLN
INTRAMUSCULAR | Status: AC | PRN
  Administered 2015-02-28: 1 mg via INTRAVENOUS

## 2015-02-28 MED ORDER — FENTANYL CITRATE (PF) 100 MCG/2ML IJ SOLN
INTRAMUSCULAR | Status: AC
  Filled 2015-02-28: qty 2

## 2015-02-28 MED ORDER — SODIUM CHLORIDE 0.9 % IV SOLN: INTRAVENOUS | Status: DC

## 2015-02-28 MED ORDER — MIDAZOLAM HCL 2 MG/2ML IJ SOLN
INTRAMUSCULAR | Status: AC
  Filled 2015-02-28: qty 2

## 2015-02-28 MED ORDER — FENTANYL CITRATE (PF) 100 MCG/2ML IJ SOLN
INTRAMUSCULAR | Status: AC | PRN
  Administered 2015-02-28 (×2): 50 ug via INTRAVENOUS

## 2015-02-28 MED ORDER — LIDOCAINE HCL 1 % IJ SOLN
INTRAMUSCULAR | Status: AC
  Filled 2015-02-28: qty 20

## 2015-02-28 NOTE — H&P (Signed)
HPI: Patient with Crohn's disease with multiple abscesses S/p perc drain 02/15/15, seen in clinic with repeat CT LLQ drain removed 9/21, right TG drain still with persistent abscess and low output Scheduled today for right TG 10 F percutaneous catheter exchange and upsize  The patient has had a H&P performed within the last 30 days, all history, medications, and exam have been reviewed. The patient denies any interval changes since the H&P.  Medications: Prior to Admission medications   Medication Sig Start Date End Date Taking? Authorizing Provider  amoxicillin-clavulanate (AUGMENTIN) 500-125 MG per tablet Take 1 tablet (500 mg total) by mouth 2 (two) times daily. 02/20/15  Yes Maryellen Pile, MD  methadone (DOLOPHINE) 10 MG/ML solution Take 4 mLs (40 mg total) by mouth daily. Patient taking differently: Take 43 mg by mouth daily.  11/22/14  Yes Jones Bales, MD  polyethylene glycol (MIRALAX / GLYCOLAX) packet Take 17 g by mouth daily.   Yes Historical Provider, MD  predniSONE (DELTASONE) 10 MG tablet Take 2 pills on day 1 then 1 pill daily 02/20/15  Yes Maryellen Pile, MD  promethazine (PHENERGAN) 12.5 MG tablet Take 1 tablet (12.5 mg total) by mouth every 6 (six) hours as needed for nausea or vomiting. 02/20/15  Yes Maryellen Pile, MD  saccharomyces boulardii (FLORASTOR) 250 MG capsule Take 1 capsule (250 mg total) by mouth 2 (two) times daily. 02/20/15  Yes Maryellen Pile, MD  simethicone (MYLICON) 80 MG chewable tablet Chew 1 tablet (80 mg total) by mouth every 6 (six) hours as needed for flatulence. 02/20/15  Yes Maryellen Pile, MD     Vital Signs: BP 115/55 mmHg  Pulse 78  Temp(Src) 98.2 F (36.8 C) (Oral)  Resp 18  Ht 5\' 2"  (1.575 m)  Wt 104 lb (47.174 kg)  BMI 19.02 kg/m2  SpO2 99%  Physical Exam  Constitutional: She is oriented to person, place, and time.  Cardiovascular: Normal rate and regular rhythm.  Exam reveals no gallop and no friction rub.   No murmur  heard. Pulmonary/Chest: Effort normal and breath sounds normal. No respiratory distress. She has no wheezes. She has no rales.  Abdominal: Soft. Bowel sounds are normal.  Right TG drain intact, no output in bag  Neurological: She is alert and oriented to person, place, and time.    Mallampati Score:  MD Evaluation Airway: WNL Heart: WNL Abdomen: WNL Chest/ Lungs: WNL ASA  Classification: 2 Mallampati/Airway Score: Two  Labs:  CBC:  Recent Labs  02/17/15 0330 02/17/15 1815 02/18/15 0421 02/19/15 0550  WBC 19.7* 19.0* 20.0* 11.1*  HGB 6.3* 7.6* 8.2* 7.9*  HCT 22.4* 25.2* 26.7* 25.9*  PLT 369 319 404* 408*    COAGS:  Recent Labs  02/15/15 0925  INR 1.15  APTT 26    BMP:  Recent Labs  02/15/15 0315 02/16/15 0407 02/18/15 0421 02/19/15 0550  NA 139 138 143 139  K 4.1 3.9 3.4* 3.8  CL 104 105 108 106  CO2 25 27 28 27   GLUCOSE 136* 148* 91 136*  BUN 11 9 17 9   CALCIUM 8.8* 8.8* 9.1 8.7*  CREATININE 0.80 0.75 0.95 0.73  GFRNONAA >60 >60 >60 >60  GFRAA >60 >60 >60 >60    LIVER FUNCTION TESTS:  Recent Labs  11/13/14 0258 11/14/14 0322 02/15/15 0315 02/18/15 0421  BILITOT 0.5 0.4 0.3 0.4  AST 20 20 15  13*  ALT 17 19 10* 10*  ALKPHOS 124 73 57 51  PROT 6.5 5.4* 6.3* 5.8*  ALBUMIN 2.7* 1.9* 2.8* 2.3*    Assessment/Plan:  Crohn's disease with abscess S/p perc drain 02/15/15, seen in clinic with repeat CT LLQ drain removed 9/21, right TG drain still with persistent abscess and low output Scheduled today for right TG 10 F percutaneous catheter exchange and upsize with sedation The patient has been NPO, no blood thinners taken, labs and vitals have been reviewed. Risks and Benefits discussed with the patient including, but not limited to bleeding, infection, or damage to adjacent structures. All of the patient's questions were answered, patient is agreeable to proceed. Consent signed and in chart.   SignedHedy Jacob 02/28/2015, 9:06  AM

## 2015-02-28 NOTE — Discharge Instructions (Signed)
Percutaneous Abscess Drain, Care After  Refer to this sheet in the next few weeks. These instructions provide you with information on caring for yourself after your procedure. Your health care provider may also give you more specific instructions. Your treatment has been planned according to current medical practices, but problems sometimes occur. Call your health care provider if you have any problems or questions after your procedure. WHAT TO EXPECT AFTER THE PROCEDURE After your procedure, it is typical to have the following:   A small amount of discomfort in the area where the drainage tube was placed.  A small amount of bruising around the area where the drainage tube was placed.  Sleepiness and fatigue for the rest of the day from the medicines used. HOME CARE INSTRUCTIONS  Rest at home for 1-2 days following your procedure or as directed by your health care provider.  If you go home right after the procedure, plan to have someone with you for 24 hours.  Do not take a bathor shower for 24 hours after your procedure.  Take medicines only as directed by your health care provider. Ask your health care provider when you can resume taking any normal medicines.  Change bandages (dressings) as directed.   You may be told to record the amount of drainage from the bag every time you empty it. Follow your health care provider's directions for emptying the bag. Write down the amount of drainage, the date, and the time you emptied it.  Call your health care provider when the drain is putting out less than 10 mL of drainage per day for 2-3 days in a row or as directed by your health care provider.  Follow your health care provider's instructions for cleaning the drainage tube. You may need to clean the tube every day so that it does not clog. SEEK MEDICAL CARE IF:  You have increased bleeding (more than a small spot) from the site where the drainage tube was placed.  You have redness,  swelling, or increasing pain around the site where the drainage tube was placed.  You notice a discharge or bad smell coming from the site where the drainage tube was placed.  You have a fever or chills.  You have pain that is not helped by medicine.  SEEK IMMEDIATE MEDICAL CARE IF:  There is leakage around the drainage tube.  The drainage tube pulls out.  You suddenly stop having drainage from the tube.  You suddenly have blood in the drainage fluid.  You become dizzy or faint.  You develop a rash.   You have nausea or vomiting.  You have difficulty breathing, feel short of breath, or feel faint.   You develop chest pain.  You have problems with your speech or vision.  You have trouble balancing or moving your arms or legs. Document Released: 10/08/2013 Document Reviewed: 07/13/2013 [facility] Patient Information 2015 [place]. This information is not intended to replace advice given to you by your health care provider. Make sure you discuss any questions you have with your health care provider.

## 2015-02-28 NOTE — Procedures (Signed)
Interventional Radiology Procedure Note  Procedure: Fluoro injection and drain upsize from 23F to 31F pigtail drain, trans-gluteal. Findings:  30cc of this feculent fluid aspirated. 20cc of saline irrigation through the tube, with aspiration. To gravity drain.  Complications: None Recommendations:  - Ok to shower tomorrow - Do not submerge  - Routine care  - continue output log - continue drain flushes - follow up with surgery, as she reports upcoming appt.  Signed,  Dulcy Fanny. Earleen Newport, DO

## 2015-03-13 ENCOUNTER — Other Ambulatory Visit: Payer: Managed Care, Other (non HMO)

## 2015-10-30 ENCOUNTER — Other Ambulatory Visit: Payer: Self-pay | Admitting: Internal Medicine

## 2015-10-30 DIAGNOSIS — Z1231 Encounter for screening mammogram for malignant neoplasm of breast: Secondary | ICD-10-CM

## 2015-12-18 ENCOUNTER — Ambulatory Visit: Payer: Managed Care, Other (non HMO)

## 2015-12-19 ENCOUNTER — Ambulatory Visit
Admission: RE | Admit: 2015-12-19 | Discharge: 2015-12-19 | Disposition: A | Discharge status: Home / Self Care | Payer: Managed Care, Other (non HMO) | Source: Ambulatory Visit | Attending: Internal Medicine | Admitting: Internal Medicine

## 2015-12-19 DIAGNOSIS — Z1231 Encounter for screening mammogram for malignant neoplasm of breast: Secondary | ICD-10-CM

## 2016-06-15 IMAGING — CT CT ABD-PELV W/ CM
2 of 5 series · 15 of 46 positions shown, 17 images · IV contrast (omnipaque)
Comparison: 11/13/2014

CLINICAL DATA: Abdominal pain, left lower quadrant.

EXAM:
CT ABDOMEN AND PELVIS WITH CONTRAST
TECHNIQUE: Multidetector CT imaging of the abdomen and pelvis was performed
using the standard protocol following bolus administration of
intravenous contrast.
CONTRAST:  80mL OMNIPAQUE IOHEXOL 300 MG/ML  SOLN

[Series 2: abd/ pelvis 5.0 i30f 1 · axial · 0.70mm/px · z∈[+806,+1186]mm · 12 of 86 slices shown, 14 images]
[im 5/86  soft-tissue]
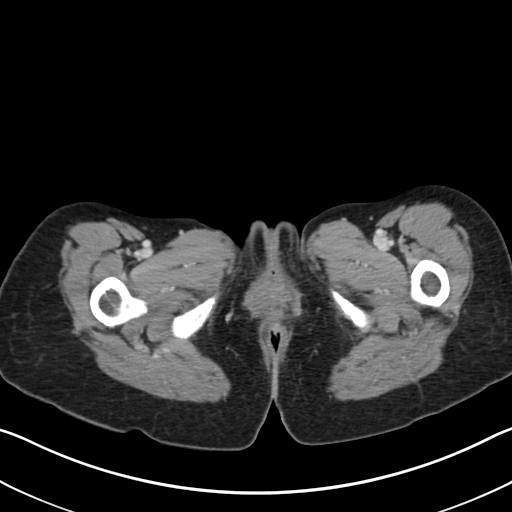
[im 5/86  bone]
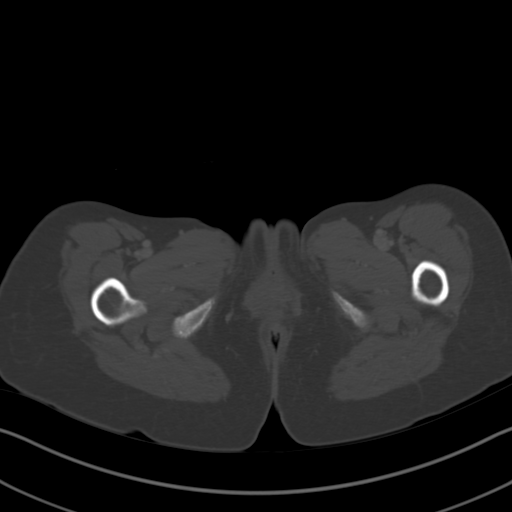
[im 13/86  soft-tissue]
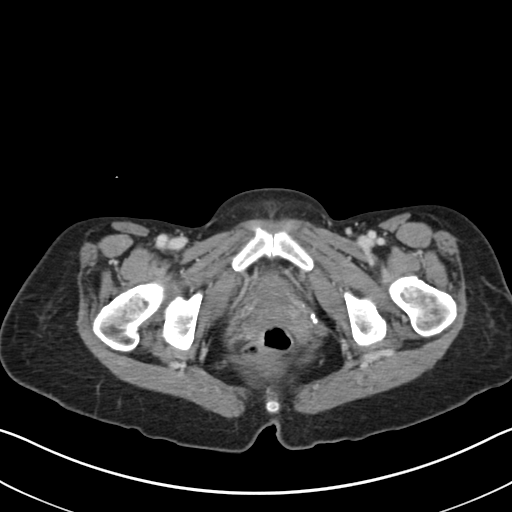
[im 18/86  soft-tissue]
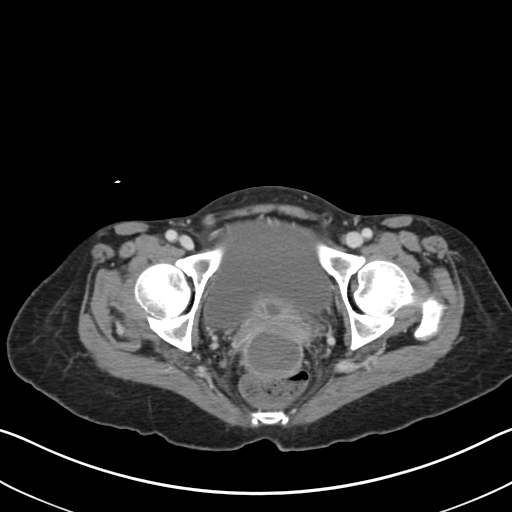
[im 26/86  soft-tissue]
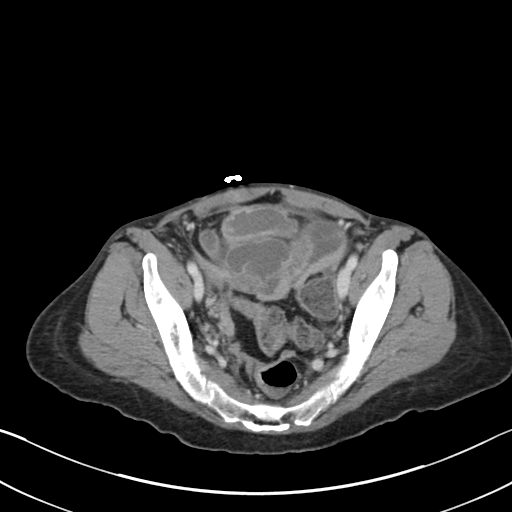
[im 35/86  soft-tissue]
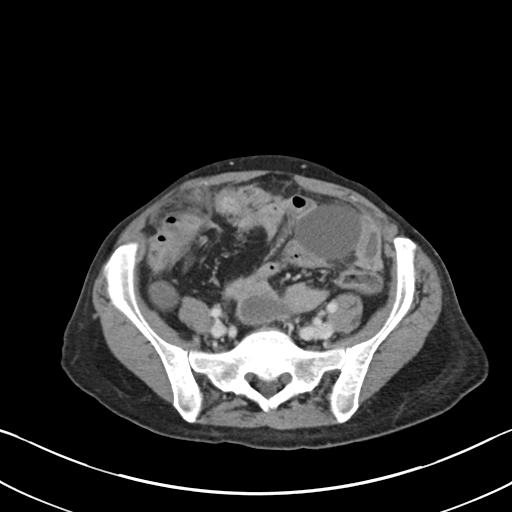
[im 39/86  soft-tissue]
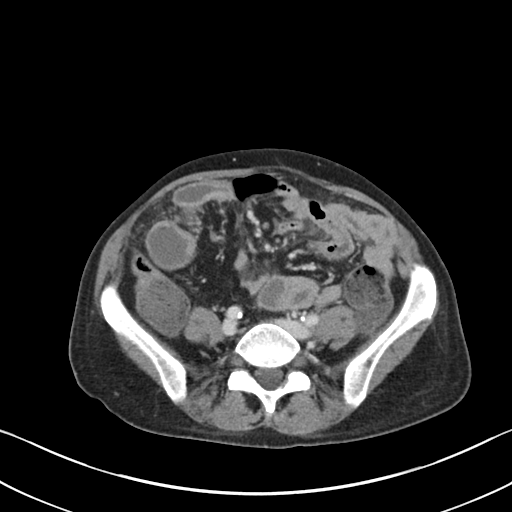
[im 47/86  soft-tissue]
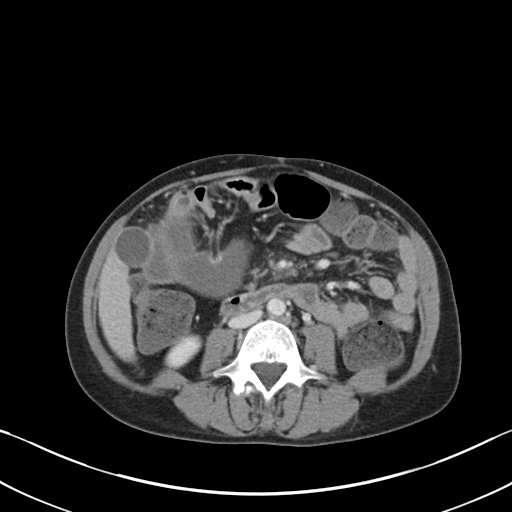
[im 52/86  soft-tissue]
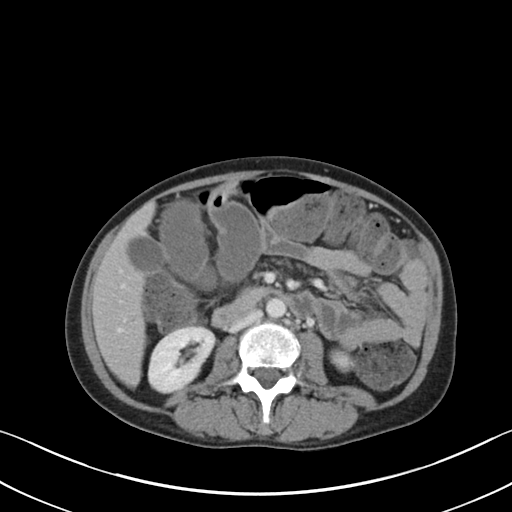
[im 60/86  soft-tissue]
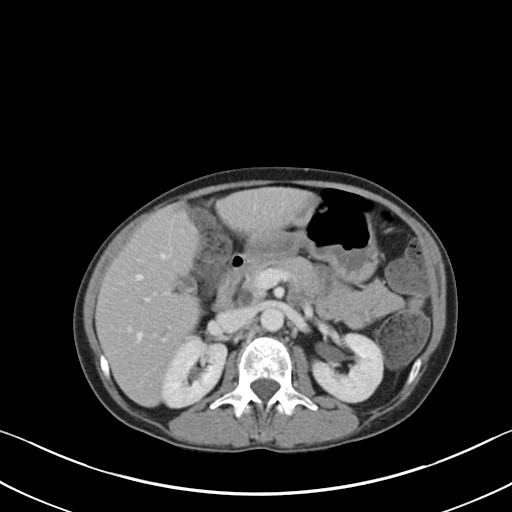
[im 60/86  bone]
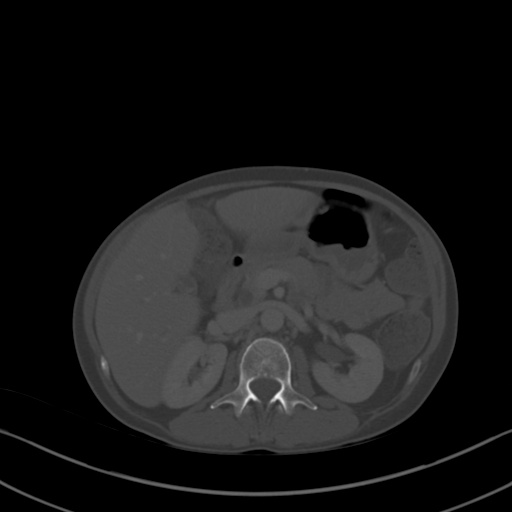
[im 69/86  soft-tissue]
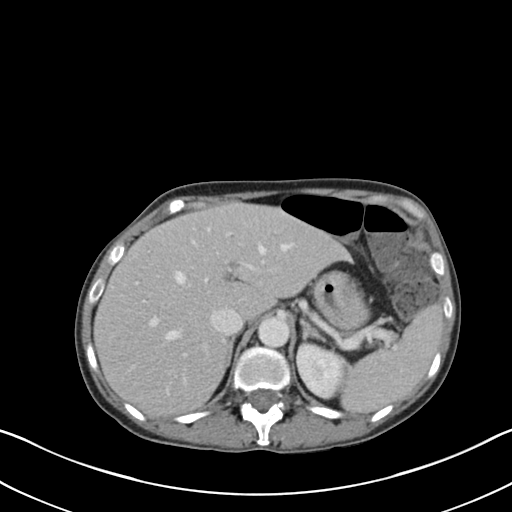
[im 73/86  soft-tissue]
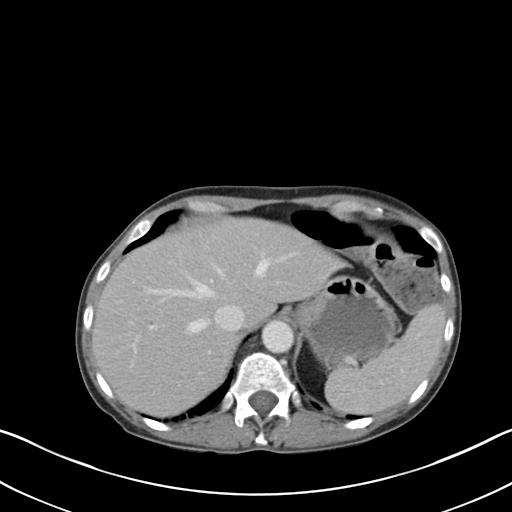
[im 81/86  soft-tissue]
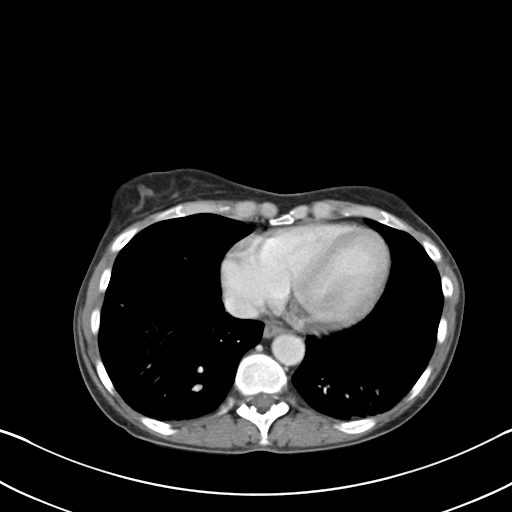

[Series 5: coronals · coronal · 0.64mm/px · 3 of 104 slices shown]
[im 35/104  soft-tissue]
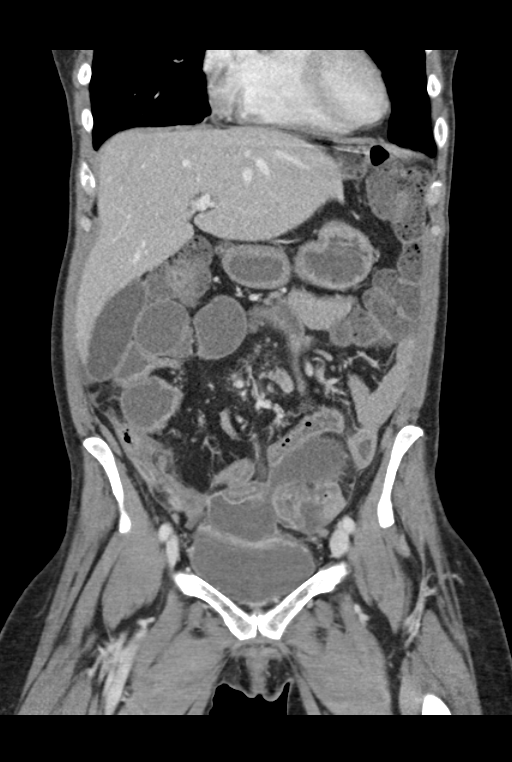
[im 46/104  soft-tissue]
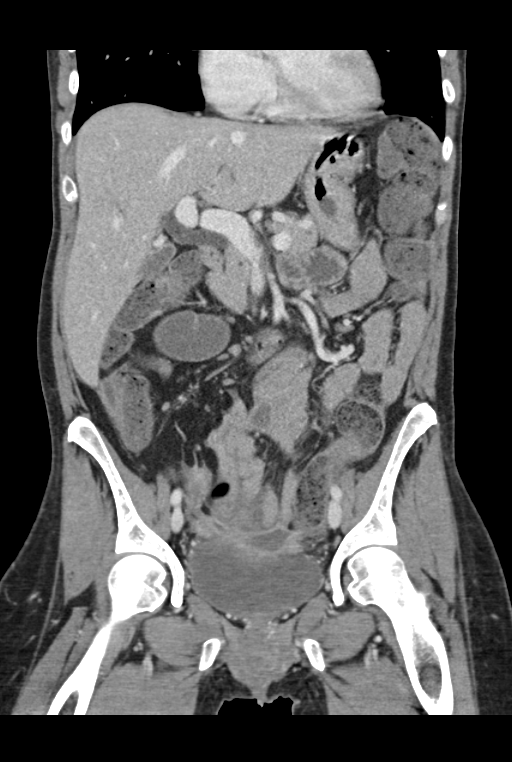
[im 58/104  soft-tissue]
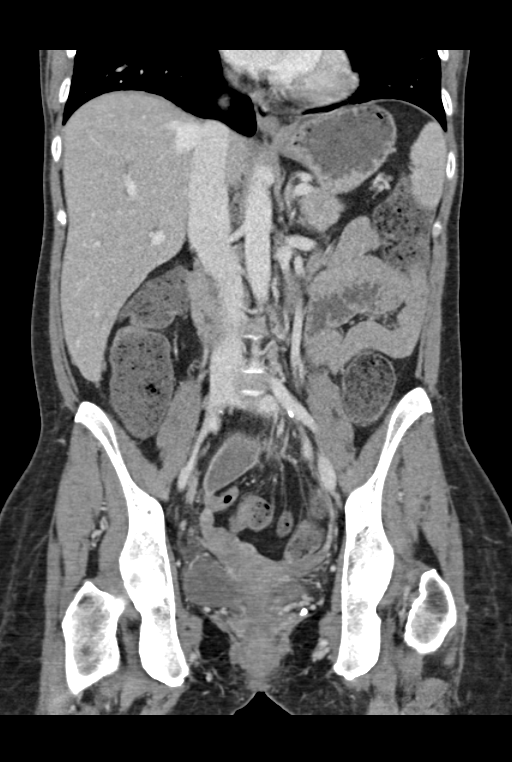

[15 of 46 positions shown; findings below may reference images not displayed]

FINDINGS: Lower chest and abdominal wall:  No contributory findings.

Hepatobiliary: No focal liver abnormality.Chronic dilation of the
common bile duct to 8 mm. No visible calcified choledocholithiasis.
Liver function tests are pending.

Pancreas: Stable mild prominence of the main pancreatic duct.

Spleen: Unremarkable.

Adrenals/Urinary Tract: Negative adrenals. No hydronephrosis or
stone. Too small to characterize low-density in the lower pole left
kidney, stable and statistically a cyst. Unremarkable bladder.

Reproductive:Hysterectomy and possible oophorectomies.

Stomach/Bowel: Multi focal skip areas of bowel inflammation with
wall thickening from submucosal edema and hypervascular mucosa.
Findings consistent with Crohn's disease. There is extensive bowel
matting and at least 2 areas of enteroenteric fistula or deep
channels. These are marked in the right abdomen on image 55 series
2. There are 5 distinct fluid collections with rim enhancement, some
with internal gas, consistent with abscess:

1. Isolated right pericolic gutter at 15 mm maximal diameter.
2. Rectovaginal recess at 42 mm diameter.
3. Posterior to the bladder is a flat collection measuring 38 mm in
width by 12 mm in thickness.
4. Ventral to this collection is a discrete appearing bilobed
collection with small communicating channel. The upper collection
measures 53 mm, the lower 40 mm.
5. Ventral to this collection is a discrete appearing collection
which measures 49 x 14 mm axial dimension.
6.
7. Intermittent areas of small bowel distention compatible with
partial obstruction.

Vascular/Lymphatic: No acute vascular abnormality. Reactive
mesenteric adenopathy.

Musculoskeletal: No acute abnormalities.
IMPRESSION: 1. Crohn's with progressive penetrating disease and multiloculated
pelvic and right flank abscess, as described above.
2. Chronically dilated common bile duct. Correlate with pending
liver function tests.

## 2016-06-15 IMAGING — CT CT IMAGE GUIDED FLUID DRAIN BY CATHETER
1 of 4 series · 10 of 32 positions shown, 16 images · non-contrast
Comparison: none

CLINICAL DATA: 53-year-old female with a history of Crohn's
disease, presents with multiloculated abscess of the abdomen pelvis.

[Series 4: i-spiral 5.0 b40f · axial · 0.73mm/px · z∈[+866,+992]mm · 10 of 46 slices shown, 16 images]
[im 5/46  soft-tissue]
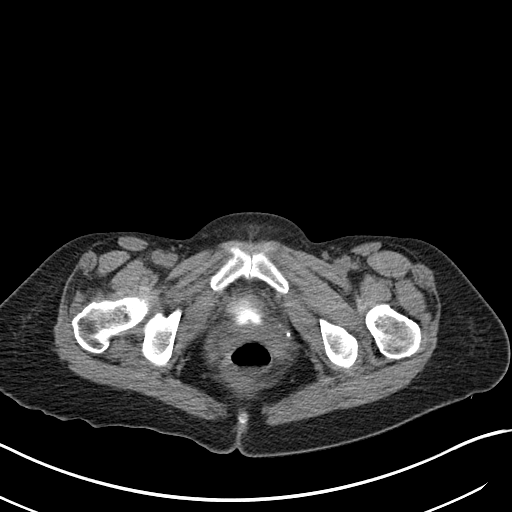
[im 5/46  bone]
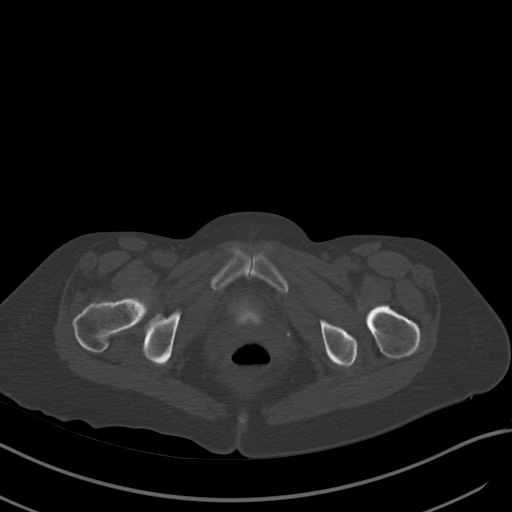
[im 9/46  soft-tissue]
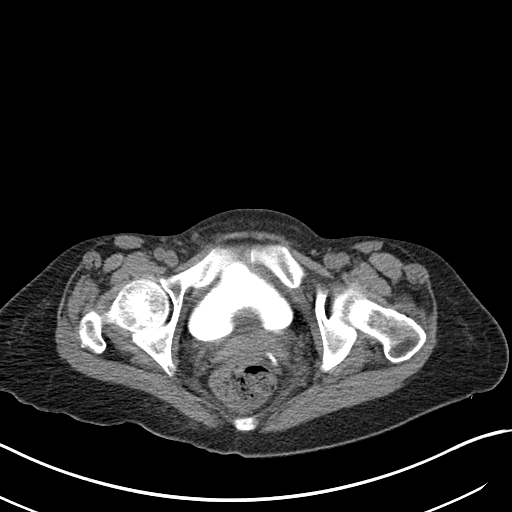
[im 13/46  soft-tissue]
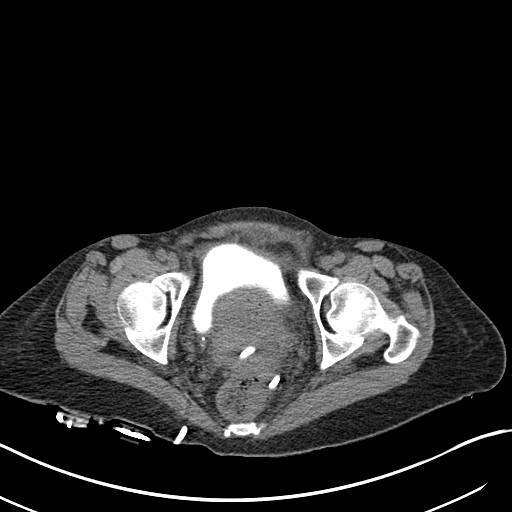
[im 17/46  soft-tissue]
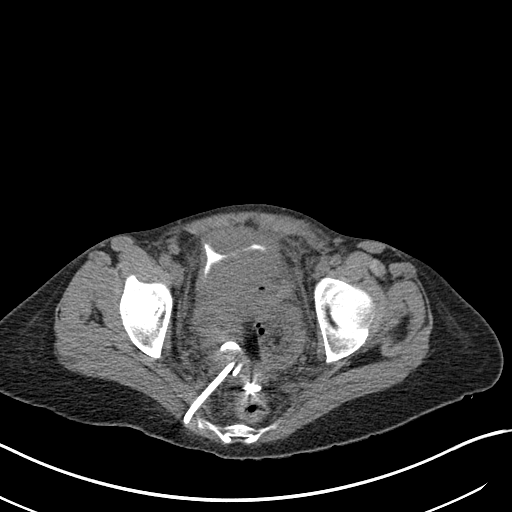
[im 21/46  soft-tissue]
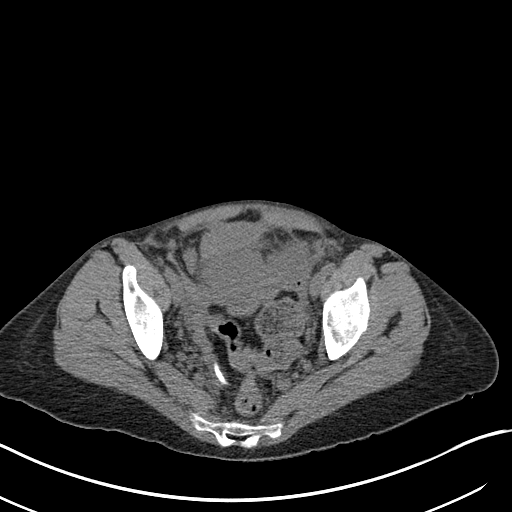
[im 25/46  soft-tissue]
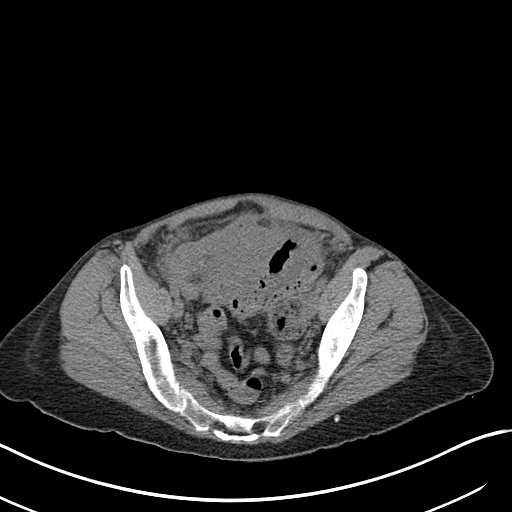
[im 29/46  soft-tissue]
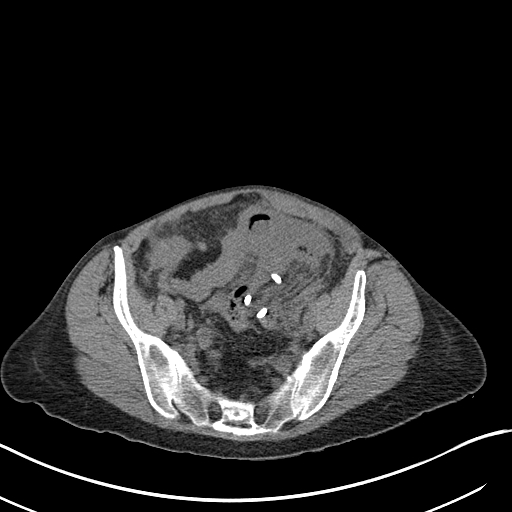
[im 29/46  lung]
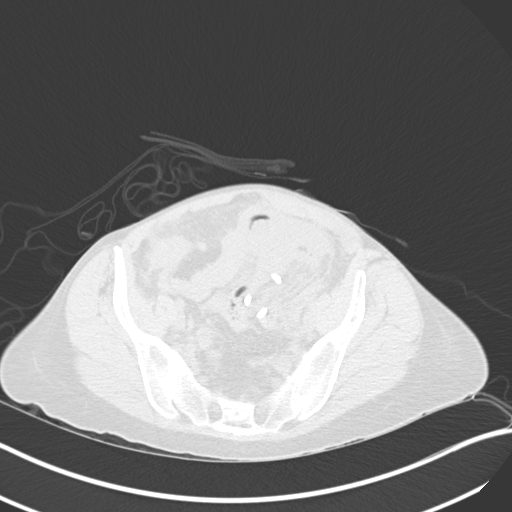
[im 33/46  soft-tissue]
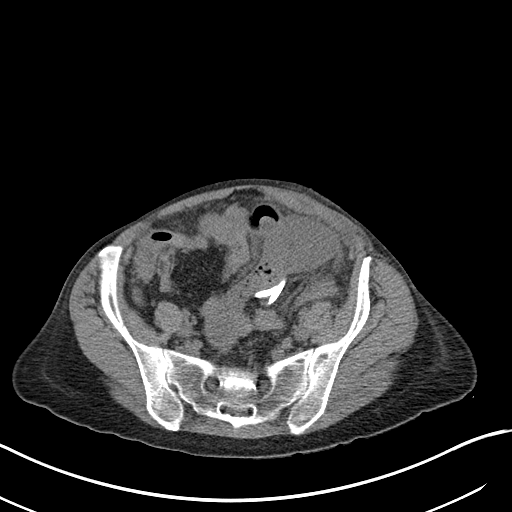
[im 33/46  lung]
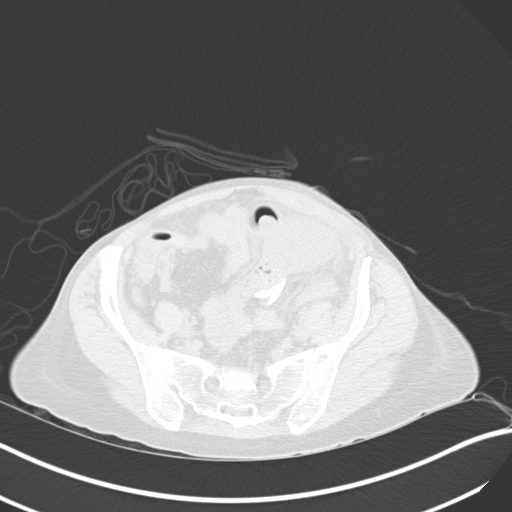
[im 37/46  soft-tissue]
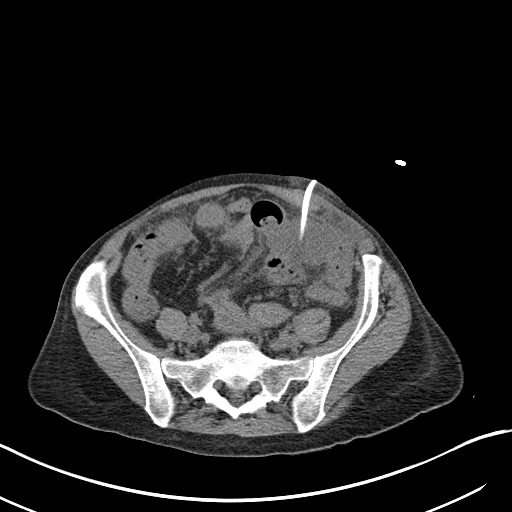
[im 37/46  lung]
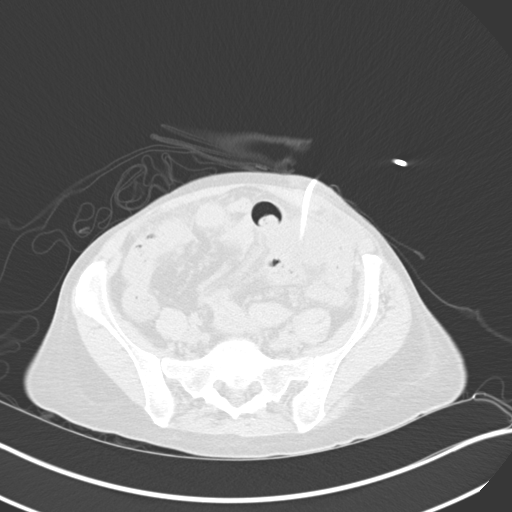
[im 37/46  bone]
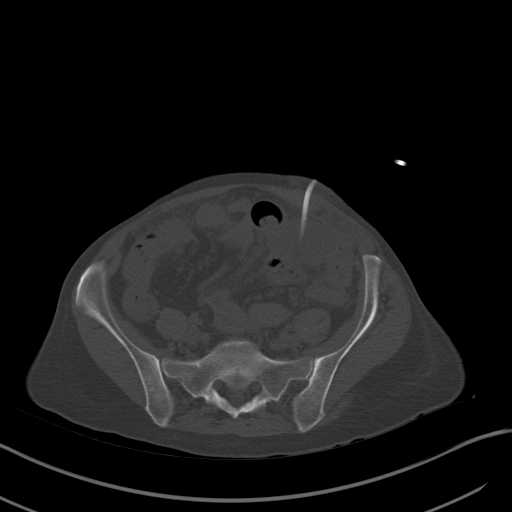
[im 41/46  soft-tissue]
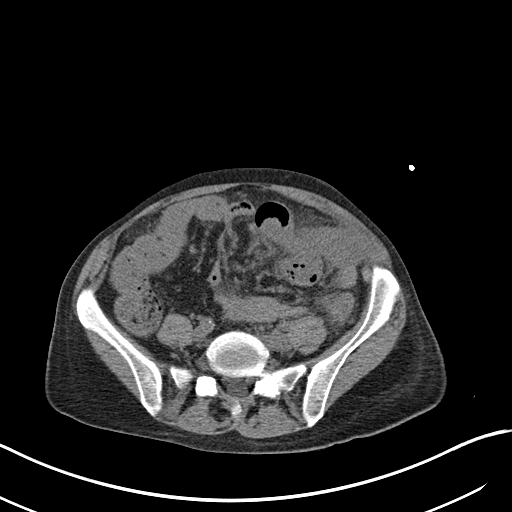
[im 41/46  lung]
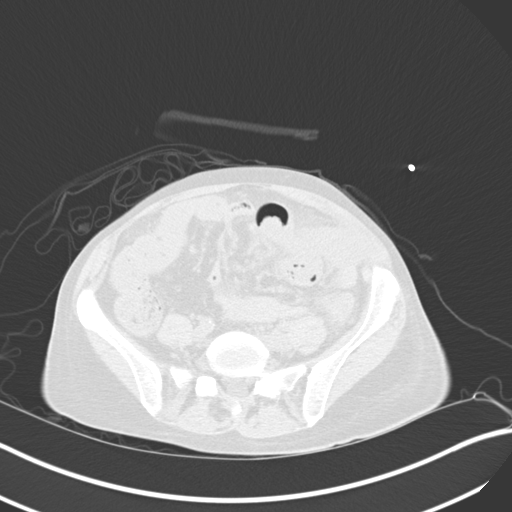

[10 of 32 positions shown; findings below may reference images not displayed]

She has been referred for evaluation for drain placement.

EXAM:
CT GUIDED ABSCESS DRAIN BIOPSY OF PELVIC ABSCESS AND LEFT LOWER
QUADRANT ABDOMINAL ABSCESS

ANESTHESIA/SEDATION:
3.0  Mg IV Versed; 150 mcg IV Fentanyl

Total Moderate Sedation Time: 45 minutes.

PROCEDURE:
The procedure risks, benefits, and alternatives were explained to
the patient. Questions regarding the procedure were encouraged and
answered. The patient understands and consents to the procedure.

Patient was first position in prone position on CT gantry table and
a CT of the abdomen pelvis was performed for planning purposes.

Safe angle approach was determined for right trans gluteal access to
pelvic abscess, and the patient is prepped and draped in the usual
sterile fashion.

The right gluteal region was prepped with chlorhexidinein a sterile
fashion, and a sterile drape was applied covering the operative
field. A sterile gown and sterile gloves were used for the
procedure. Local anesthesia was provided with 1% Lidocaine.

Once the patient is prepped and draped sterilely, 1% lidocaine was
used for local anesthesia. A small skin incision was made with 11
blade scalpel, and a trocar needle was advanced safely into pelvic
abscess using CT guidance. We confirmed needle tip position with
aspiration of purulent material and was CT imaging.

Using modified Seldinger technique, a 10 French pigtail catheter
drain was placed. Approximately 60 cc of highly viscous purulent
fluid was aspirated. A sample sent to the lab.

The catheter pigtail was locked, and the catheter was sutured in
position. A sterile bandage was placed.

The patient was then turned in supine position for drainage of a
left lower quadrant abscess.

The left lower quadrant was prepped with chlorhexidinein a sterile
fashion, and a sterile drape was applied covering the operative
field. A sterile gown and sterile gloves were used for the
procedure. Local anesthesia was provided with 1% Lidocaine.

Once the patient is prepped and draped sterilely, 1% lidocaine was
used for local anesthesia. A small stab incision was made with 11
blade scalpel, and a trocar needle was advanced with CT guidance
into fluid collection of the left lower quadrant. We confirmed
position with aspiration of fluid and with CT imaging.

Using modified Seldinger technique, a 10 French pigtail catheter
drain was placed into the left lower quadrant fluid collection.
Approximately 50 cc of turbid thin fluid was aspirated. Drainage bag
was placed. Catheter was sutured in position and a sterile dressing
was placed.

The patient tolerated the procedure well and remained
hemodynamically stable throughout.

No complications were encountered and no significant blood loss was
encountered.
FINDINGS: Initial CT imaging in the prone position demonstrates abscess within
the pelvis, similar in size and location to prior cross-sectional
imaging.

Images during the case demonstrate safe placement of 10 French
catheter into pelvic abscess. 60 cc of viscous purulent fluid was
removed.

In knee supine position, CT image demonstrates fluid collection of
the left lower quadrant (images stored in PACs are mislabeled and
reversed).

After placement of 10 French catheter, there is significant
decompression of this fluid collection. 50 cc of turbid thin fluid
removed.
IMPRESSION: Status post placement of right trans gluteal 10 French pigtail
catheter into pelvic abscess, as well as percutaneous 10 French
catheter placement into left lower quadrant fluid collection. Sample
was sent from each collection to the lab for analysis.

## 2016-06-28 IMAGING — XA IR CATHETER TUBE CHANGE
1 series · 12 of 12 positions shown · non-contrast
Comparison: Prior CT 02/26/2015

INDICATION: 53-year-old female with a history of Crohn disease, with abscess
formation as complication.

[Series 1: run · 12 of 12 slices shown]
[im 1/12]
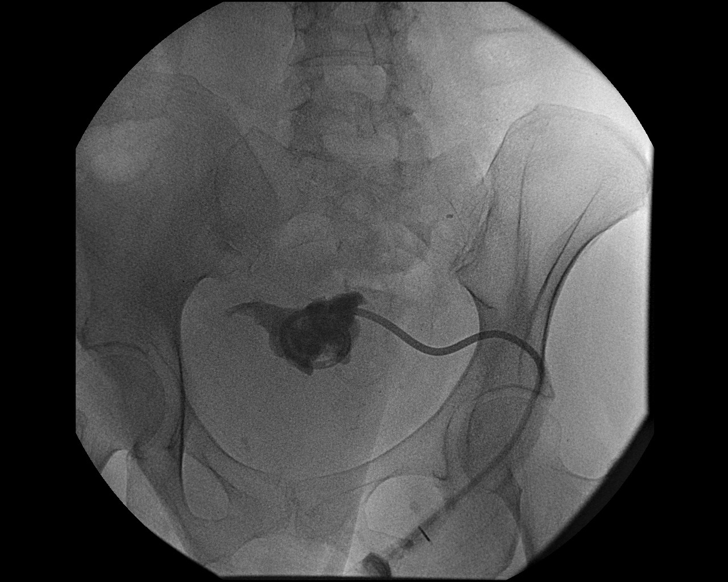
[im 2/12]
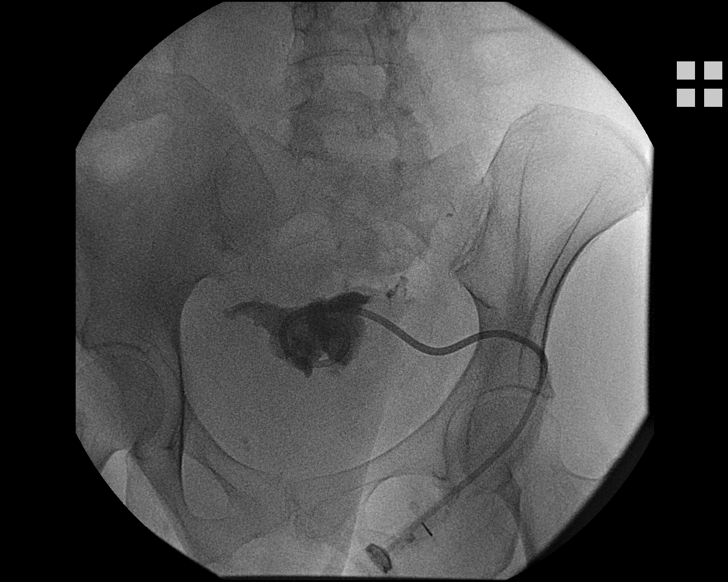
[im 3/12]
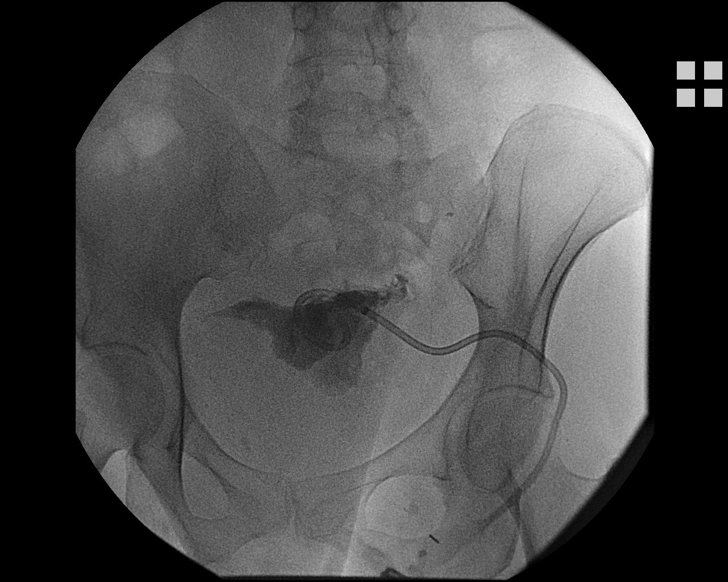
[im 4/12]
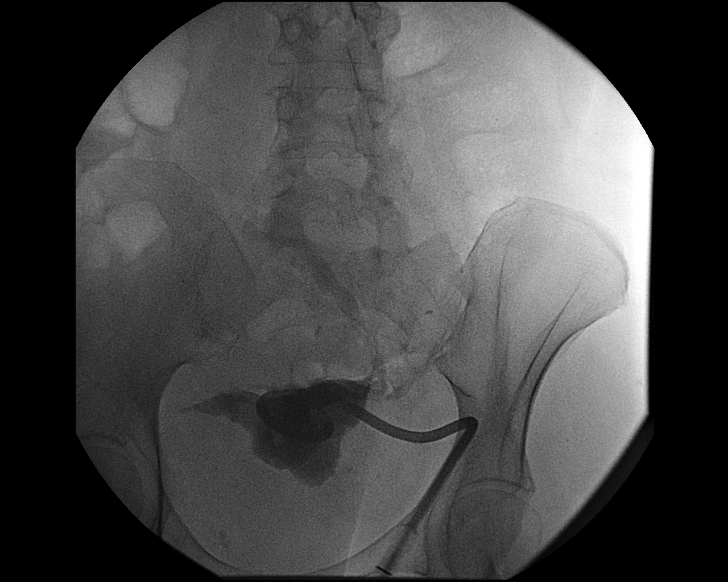
[im 5/12]
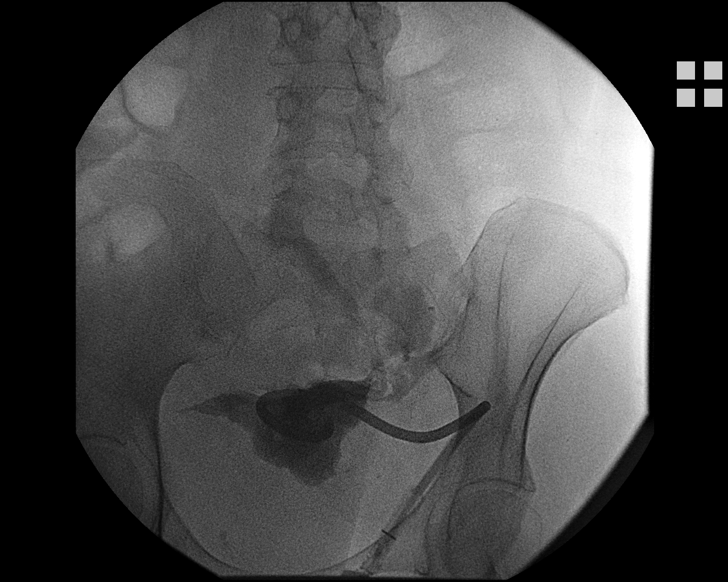
[im 6/12]
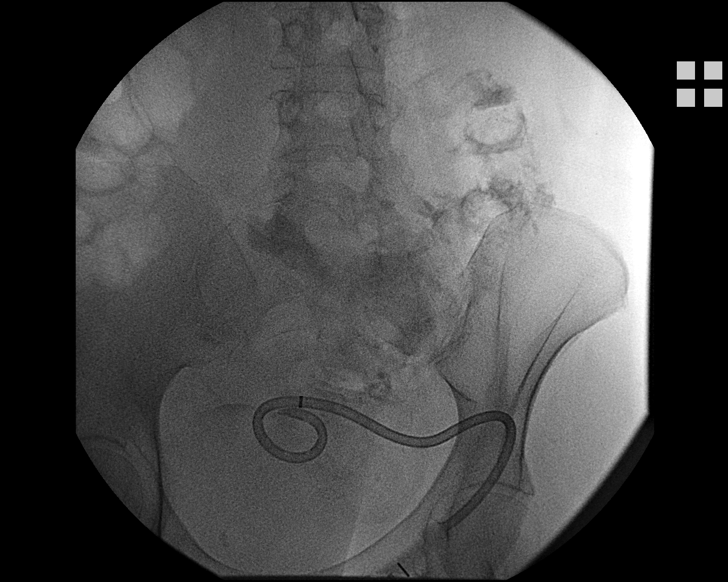
[im 7/12]
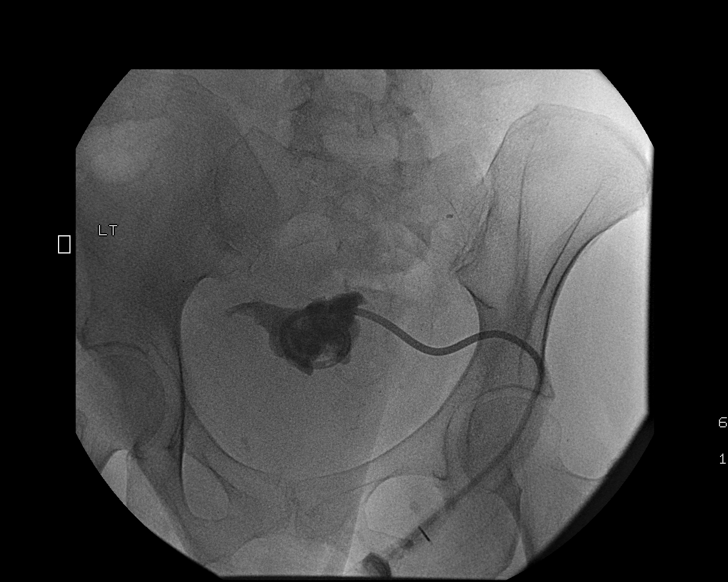
[im 8/12]
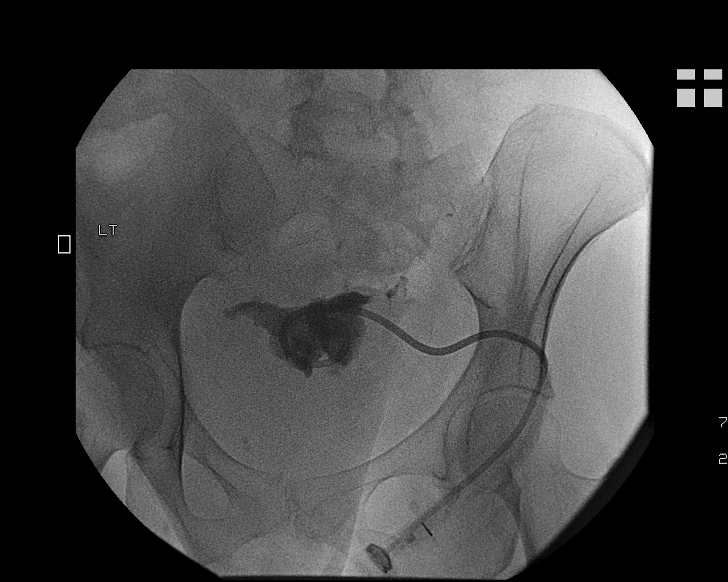
[im 9/12]
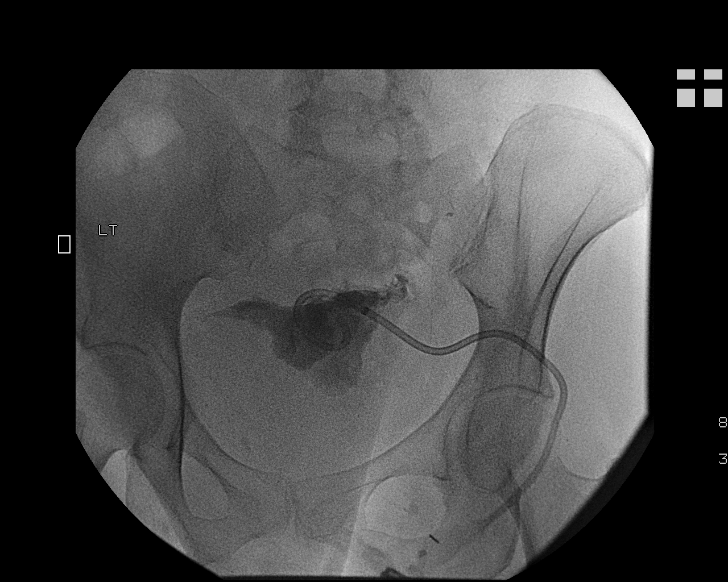
[im 10/12]
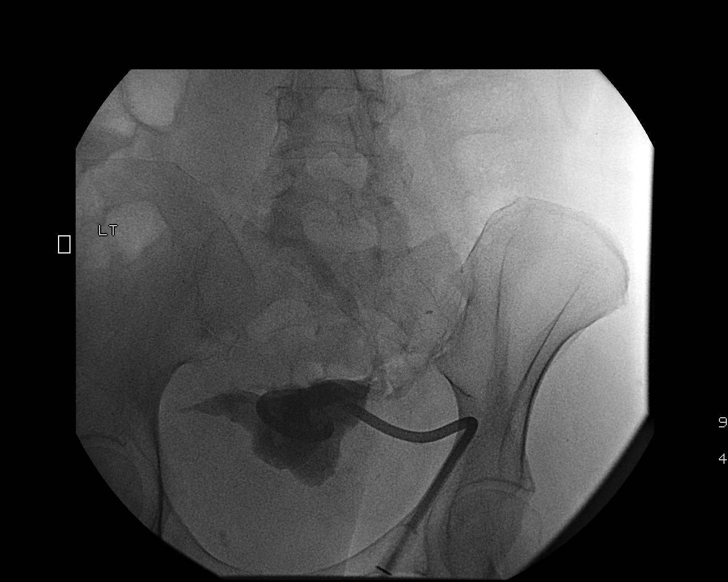
[im 11/12]
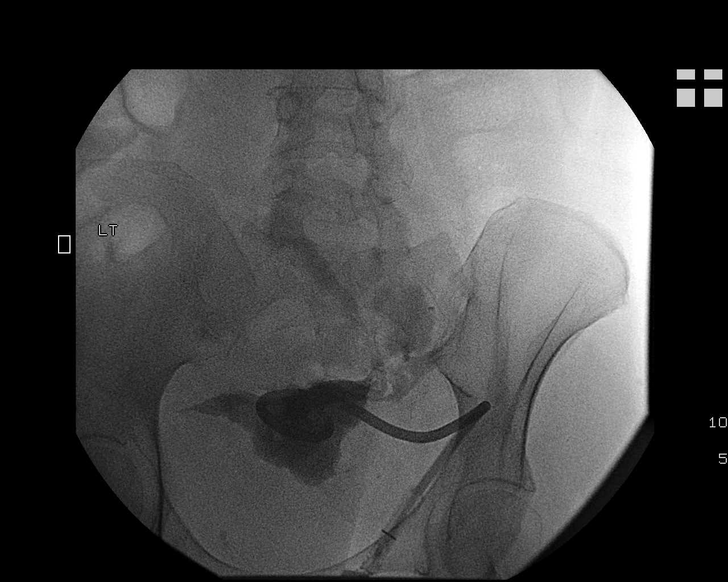
[im 12/12]
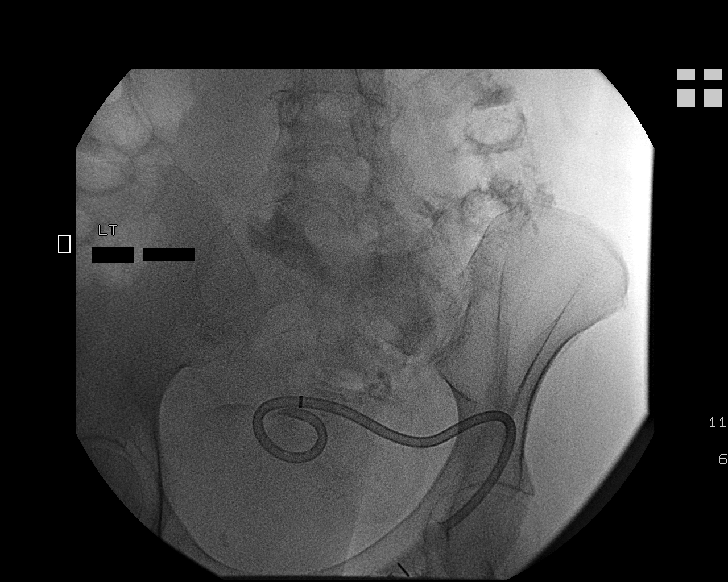

[12 of 12 positions shown; findings below may reference images not displayed]

CT demonstrating multiloculated abscess in the abdomen and pelvis
performed 11/13/2014. Repeat CT 02/15/2015 with persistent symptoms.
CT drainage performed 02/15/2015, with abdominal and trans gluteal
drain placed.

Follow-up CT 02/26/2015 demonstrates resolution of fluid adjacent to
the abdominal drain with persisting fluid in the pelvis.

She has been referred for up sizing of the pelvic trans gluteal
drain after removal of the anterior (abdominal drain).

The patient has kept the log of output, with the past 24 hours
demonstrating significant increased output to greater than 50 cc per
day, which was not increased from [DATE]-[DATE] when she recorded only
a few cc of output.

EXAM:
IR CATHETER TUBE CHANGE
MEDICATIONS:
The patient is currently admitted to the hospital and receiving
intravenous antibiotics. The antibiotics were administered within an
appropriate time frame prior to the initiation of the procedure.

ANESTHESIA/SEDATION:
Fentanyl 100 mcg IV; Versed 1.0 mg IV

Total Moderate Sedation time

Fifteen minutes

CONTRAST:  None

COMPLICATIONS:
None

PROCEDURE:
Informed written consent was obtained from the patient after a
discussion of the risks, benefits and alternatives to treatment.
Patient was positioned prone position on the fluoroscopy table.

The gluteal region and the indwelling drain were prepped and draped
in the usual sterile fashion. 1% lidocaine views from a anesthesia.

Generous infiltration of the skin and subcutaneous tissues was
performed with 1% lidocaine without epinephrine.

Small amount of contrast was infused through the drain confirming
position within a persisting abscess cavity.

The catheter was ligated, and a Bentson wire was used to exchange
the indwelling 10 French drain for a 14 French drain.

Repeat contrast injection confirmed position of the drain in the
abscess cavity.

40 cc of saline flush with aspiration of residual contents was
performed.

Patient tolerated the procedure well and remained hemodynamically
stable throughout.

No complications encountered and no significant blood loss
encounter.
FINDINGS: Initial images demonstrates 10 French drain within residual abscess
cavity of the pelvis.

Images during the case demonstrate exchange for 14 French pigtail
catheter drain.

Final image demonstrates resolution of residual fluid surrounding
the pigtail catheter after aspiration.

Final image also demonstrates fistulous connection to bowel, with
partial opacification of bowel loops.
IMPRESSION: Status post trans gluteal drain up size with 14 French pigtail
catheter placed.

Final image demonstrates fistulous connection to the bowel with
partial opacification of bowel loops.
